# Patient Record
Sex: Male | Born: 1935 | Race: White | Hispanic: No | Marital: Married | Smoking: Former smoker
Health system: Southern US, Community
[De-identification: ages and names within clinical notes are randomized; demographics above are authoritative.]

## PROBLEM LIST (undated history)

## (undated) DIAGNOSIS — K3532 Acute appendicitis with perforation and localized peritonitis, without abscess: Secondary | ICD-10-CM

## (undated) DIAGNOSIS — M069 Rheumatoid arthritis, unspecified: Secondary | ICD-10-CM

## (undated) DIAGNOSIS — K439 Ventral hernia without obstruction or gangrene: Secondary | ICD-10-CM

## (undated) HISTORY — DX: Rheumatoid arthritis, unspecified: M06.9

## (undated) HISTORY — PX: OTHER SURGICAL HISTORY: SHX169

## (undated) HISTORY — DX: Acute appendicitis with perforation, localized peritonitis, and gangrene, without abscess: K35.32

## (undated) HISTORY — PX: HAND SURGERY: SHX662

## (undated) HISTORY — DX: Ventral hernia without obstruction or gangrene: K43.9

---

## 1976-01-02 HISTORY — PX: APPENDECTOMY: SHX54

## 1980-01-02 HISTORY — PX: SHOULDER SURGERY: SHX246

## 1990-01-01 HISTORY — PX: SHOULDER SURGERY: SHX246

## 1998-03-04 ENCOUNTER — Inpatient Hospital Stay (HOSPITAL_COMMUNITY): Admission: RE | Admit: 1998-03-04 | Discharge: 1998-03-07 | Payer: Self-pay | Admitting: Orthopaedic Surgery

## 1998-07-07 ENCOUNTER — Ambulatory Visit (HOSPITAL_BASED_OUTPATIENT_CLINIC_OR_DEPARTMENT_OTHER): Admission: RE | Admit: 1998-07-07 | Discharge: 1998-07-07 | Payer: Self-pay | Admitting: Orthopedic Surgery

## 1998-09-13 ENCOUNTER — Ambulatory Visit (HOSPITAL_BASED_OUTPATIENT_CLINIC_OR_DEPARTMENT_OTHER): Admission: RE | Admit: 1998-09-13 | Discharge: 1998-09-13 | Payer: Self-pay | Admitting: Orthopaedic Surgery

## 1998-10-04 ENCOUNTER — Other Ambulatory Visit: Admission: RE | Admit: 1998-10-04 | Discharge: 1998-10-04 | Payer: Self-pay

## 1999-01-27 ENCOUNTER — Ambulatory Visit (HOSPITAL_BASED_OUTPATIENT_CLINIC_OR_DEPARTMENT_OTHER): Admission: RE | Admit: 1999-01-27 | Discharge: 1999-01-27 | Payer: Self-pay | Admitting: Orthopedic Surgery

## 1999-10-10 ENCOUNTER — Encounter: Payer: Self-pay | Admitting: Orthopedic Surgery

## 1999-10-12 ENCOUNTER — Encounter (INDEPENDENT_AMBULATORY_CARE_PROVIDER_SITE_OTHER): Payer: Self-pay | Admitting: *Deleted

## 1999-10-13 ENCOUNTER — Inpatient Hospital Stay (HOSPITAL_COMMUNITY): Admission: RE | Admit: 1999-10-13 | Discharge: 1999-10-14 | Payer: Self-pay | Admitting: Orthopedic Surgery

## 2000-01-31 ENCOUNTER — Ambulatory Visit (HOSPITAL_BASED_OUTPATIENT_CLINIC_OR_DEPARTMENT_OTHER): Admission: RE | Admit: 2000-01-31 | Discharge: 2000-01-31 | Payer: Self-pay | Admitting: Orthopedic Surgery

## 2000-05-16 ENCOUNTER — Ambulatory Visit (HOSPITAL_COMMUNITY): Admission: RE | Admit: 2000-05-16 | Discharge: 2000-05-16 | Payer: Self-pay | Admitting: Internal Medicine

## 2000-05-16 ENCOUNTER — Encounter: Payer: Self-pay | Admitting: Internal Medicine

## 2002-07-15 ENCOUNTER — Ambulatory Visit (HOSPITAL_COMMUNITY): Admission: RE | Admit: 2002-07-15 | Discharge: 2002-07-15 | Payer: Self-pay | Admitting: Internal Medicine

## 2002-07-15 ENCOUNTER — Encounter: Payer: Self-pay | Admitting: Internal Medicine

## 2003-01-11 ENCOUNTER — Ambulatory Visit (HOSPITAL_COMMUNITY): Admission: RE | Admit: 2003-01-11 | Discharge: 2003-01-11 | Payer: Self-pay | Admitting: Orthopedic Surgery

## 2003-04-27 ENCOUNTER — Encounter (INDEPENDENT_AMBULATORY_CARE_PROVIDER_SITE_OTHER): Payer: Self-pay | Admitting: *Deleted

## 2003-04-28 ENCOUNTER — Inpatient Hospital Stay (HOSPITAL_COMMUNITY): Admission: RE | Admit: 2003-04-28 | Discharge: 2003-05-01 | Payer: Self-pay | Admitting: Orthopedic Surgery

## 2003-09-30 ENCOUNTER — Inpatient Hospital Stay (HOSPITAL_COMMUNITY): Admission: RE | Admit: 2003-09-30 | Discharge: 2003-10-04 | Payer: Self-pay | Admitting: Orthopedic Surgery

## 2003-09-30 ENCOUNTER — Encounter (INDEPENDENT_AMBULATORY_CARE_PROVIDER_SITE_OTHER): Payer: Self-pay | Admitting: *Deleted

## 2004-02-24 ENCOUNTER — Encounter (INDEPENDENT_AMBULATORY_CARE_PROVIDER_SITE_OTHER): Payer: Self-pay | Admitting: *Deleted

## 2004-02-24 ENCOUNTER — Inpatient Hospital Stay (HOSPITAL_COMMUNITY): Admission: RE | Admit: 2004-02-24 | Discharge: 2004-02-27 | Payer: Self-pay | Admitting: Orthopedic Surgery

## 2004-07-20 ENCOUNTER — Ambulatory Visit (HOSPITAL_COMMUNITY): Admission: RE | Admit: 2004-07-20 | Discharge: 2004-07-20 | Payer: Self-pay | Admitting: Family Medicine

## 2004-07-21 ENCOUNTER — Ambulatory Visit: Payer: Self-pay | Admitting: Internal Medicine

## 2005-01-25 ENCOUNTER — Encounter (INDEPENDENT_AMBULATORY_CARE_PROVIDER_SITE_OTHER): Payer: Self-pay | Admitting: *Deleted

## 2005-01-25 ENCOUNTER — Inpatient Hospital Stay (HOSPITAL_COMMUNITY): Admission: RE | Admit: 2005-01-25 | Discharge: 2005-01-28 | Payer: Self-pay | Admitting: Orthopedic Surgery

## 2005-04-13 ENCOUNTER — Encounter (INDEPENDENT_AMBULATORY_CARE_PROVIDER_SITE_OTHER): Payer: Self-pay | Admitting: *Deleted

## 2005-04-13 ENCOUNTER — Inpatient Hospital Stay (HOSPITAL_COMMUNITY): Admission: RE | Admit: 2005-04-13 | Discharge: 2005-04-15 | Payer: Self-pay | Admitting: Orthopedic Surgery

## 2006-01-24 ENCOUNTER — Emergency Department (HOSPITAL_COMMUNITY): Admission: EM | Admit: 2006-01-24 | Discharge: 2006-01-25 | Payer: Self-pay | Admitting: Emergency Medicine

## 2006-12-27 ENCOUNTER — Encounter (INDEPENDENT_AMBULATORY_CARE_PROVIDER_SITE_OTHER): Payer: Self-pay | Admitting: Orthopedic Surgery

## 2006-12-27 ENCOUNTER — Inpatient Hospital Stay (HOSPITAL_COMMUNITY): Admission: RE | Admit: 2006-12-27 | Discharge: 2006-12-30 | Payer: Self-pay | Admitting: Orthopedic Surgery

## 2008-12-10 ENCOUNTER — Ambulatory Visit: Payer: Self-pay | Admitting: Internal Medicine

## 2008-12-10 DIAGNOSIS — K439 Ventral hernia without obstruction or gangrene: Secondary | ICD-10-CM

## 2008-12-12 DIAGNOSIS — G809 Cerebral palsy, unspecified: Secondary | ICD-10-CM | POA: Insufficient documentation

## 2008-12-12 DIAGNOSIS — M069 Rheumatoid arthritis, unspecified: Secondary | ICD-10-CM | POA: Insufficient documentation

## 2009-04-14 ENCOUNTER — Encounter: Payer: Self-pay | Admitting: Internal Medicine

## 2009-05-02 ENCOUNTER — Ambulatory Visit: Payer: Self-pay | Admitting: Internal Medicine

## 2009-05-31 ENCOUNTER — Inpatient Hospital Stay (HOSPITAL_COMMUNITY): Admission: RE | Admit: 2009-05-31 | Discharge: 2009-06-02 | Payer: Self-pay | Admitting: Orthopedic Surgery

## 2010-01-31 NOTE — Letter (Signed)
Summary: [facility]  [facility]   Imported By: Sherian Rein 05/03/2009 15:06:02  _____________________________________________________________________  External Attachment:    Type:   Image     Comment:   External Document

## 2010-01-31 NOTE — Assessment & Plan Note (Signed)
Summary: office visit before surg on may 31/ ab   Vital Signs:  Patient profile:   77 year old male Height:      62 inches Weight:      128 pounds O2 Sat:      98 % on Room air Temp:     98.1 degrees F oral Pulse rate:   68 / minute BP sitting:   144 / 72  (left arm) Cuff size:   regular  Vitals Entered By: Bill Salinas CMA (May 02, 2009 8:57 AM)  O2 Flow:  Room air CC: office visit before Left hip replacement with Dr Charlann Boxer on May 31st/  pt has never had pneumonia vaccine or tetanus/ ab   Primary Care Provider:  Jacques Navy MD  CC:  office visit before Left hip replacement with Dr Charlann Boxer on May 31st/  pt has never had pneumonia vaccine or tetanus/ ab.  History of Present Illness: Patient presents for preoperative evaluation for left TKR with Dr. Charlann Boxer. He is having on-ging pain in the left hip but gets adequate relief with ibuprofen 400mg  once a day. He has no other active medical complaints.   Current Medications (verified): 1)  Ibuprofen 200 Mg Tabs (Ibuprofen) .... 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) ABDOMINAL WALL HERNIA (ICD-553.20)  Past Surgical History: Last updated: 12/24/08 Appendectomy-ruptured Laparotomy-exploratory: x 3 due to complications of ruptured appendix Shoulder replacement '82 - post-traumatic Metatarsal joint reconstruction - '93 Elbow surgery  Family History: Last updated: 24-Dec-2008 Parents deceased Negative - colon or prostate cancer  Social History: Last updated: 24-Dec-2008 College educated married 20 yrs - divorced; married '84 1 son - murdered @ 80; 5 daughters - '60, '62, '66, '91, '93; 3 grandchildren work - reitred years ago; primary homemaker, 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    Physical Exam  General:  Thin older white male looking younger than his stated age. He is no distress Head:  normocephalic and atraumatic.   Eyes:  vision grossly intact, pupils equal, pupils round, corneas and lenses clear, and no injection.   Ears:  R ear normal and L ear normal.  some cerumen Nose:  no external deformity and no external erythema.   Mouth:  good dental repair no oral lesions Neck:  supple, full ROM, no thyromegaly, and no cervical lymphadenopathy.   Chest Wall:  no deformities and no tenderness.   Lungs:  normal respiratory effort, no intercostal retractions, no accessory muscle use, no dullness, and no wheezes.   Heart:  normal rate, regular rhythm, no JVD, and no HJR.   Abdomen:  soft, non-tender, normal bowel sounds, no masses, no guarding, and no hepatomegaly.   Rectal:  deferred Prostate:  deferred Msk:  chronic deformities  both hands from arthritis with joint destruction. No ddeformity of the LE. Pulses:  2+ radial and DP pulses Extremities:  No clubbing, cyanosis, edema, or deformity noted with normal full range of motion of all joints.   Neurologic:  alert & oriented X3, cranial nerves II-XII intact, strength normal in all extremities, sensation intact to light touch, and DTRs symmetrical and normal.   Skin:  turgor  normal, color normal, no rashes, no ulcerations, and no edema.   Cervical Nodes:  no anterior cervical adenopathy and no posterior cervical adenopathy.   Axillary Nodes:  no R axillary adenopathy and no L axillary adenopathy.   Psych:  Oriented X3, memory intact for recent and remote, normally interactive, and good eye contact.     Impression & Recommendations:  Problem # 1:  RHEUMATOID ARTHRITIS (ICD-714.0) Stable. ON no remiitive  agents.  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 EKG with minor sinus arrhythmia. V1-V2 with abnl QRS which may represent old injury. He has no current cardiac issues.  Patient is medically cleared for surgery.   Complete Medication List: 1)  Ibuprofen 200 Mg Tabs (Ibuprofen) .... As needed for arthritis   Not Administered:    Influenza Vaccine # 1 not given due to: declined

## 2010-03-20 LAB — BASIC METABOLIC PANEL
CO2: 27 mEq/L (ref 19–32)
CO2: 26 mEq/L (ref 19–32)
Calcium: 8.1 mg/dL — ABNORMAL LOW (ref 8.4–10.5)
Calcium: 8.9 mg/dL (ref 8.4–10.5)
Chloride: 105 mEq/L (ref 96–112)
Chloride: 103 mEq/L (ref 96–112)
Creatinine, Ser: 0.64 mg/dL (ref 0.4–1.5)
GFR calc Af Amer: >60 mL/min (ref >60)
GFR calc Af Amer: >60 mL/min (ref >60)
GFR calc Af Amer: >60 mL/min (ref >60)
GFR calc non Af Amer: >60 mL/min (ref >60)
Glucose, Bld: 86 mg/dL (ref 70–99)
Potassium: 4.1 mEq/L (ref 3.5–5.1)
Potassium: 4.5 mEq/L (ref 3.5–5.1)
Potassium: 4.5 mEq/L (ref 3.5–5.1)
Sodium: 135 mEq/L (ref 135–145)
Sodium: 136 mEq/L (ref 135–145)

## 2010-03-20 LAB — CBC
HCT: 26 % — ABNORMAL LOW (ref 39.0–52.0)
MCHC: 33.3 g/dL (ref 30.0–36.0)
MCV: 83.8 fL (ref 78.0–100.0)
Platelets: 169 10*3/uL (ref 150–400)
RBC: 2.93 MIL/uL — ABNORMAL LOW (ref 4.22–5.81)
RBC: 4.53 MIL/uL (ref 4.22–5.81)
RDW: 15.5 % (ref 11.5–15.5)
WBC: 6.8 10*3/uL (ref 4.0–10.5)

## 2010-03-20 LAB — URINALYSIS, ROUTINE W REFLEX MICROSCOPIC
Bilirubin Urine: NEGATIVE
Ketones, ur: NEGATIVE mg/dL
Leukocytes, UA: NEGATIVE
Protein, ur: 100 mg/dL — AB

## 2010-03-20 LAB — URINE MICROSCOPIC-ADD ON

## 2010-03-20 LAB — DIFFERENTIAL
Monocytes Relative: 9 % (ref 3–12)
Neutrophils Relative %: 72 % (ref 43–77)

## 2010-03-20 LAB — ABO/RH: ABO/RH(D): O POS

## 2010-03-20 LAB — TYPE AND SCREEN: ABO/RH(D): O POS

## 2010-05-16 NOTE — Op Note (Signed)
[name], [name]              ACCOUNT NO.:  [id number]   MEDICAL RECORD NO.:  [id number]          PATIENT TYPE:  INP   LOCATION:  2854                         FACILITY:  [facility]   PHYSICIAN:  Dionne Ano. Gramig III, M.D.DATE OF BIRTH:  1932-08-11   DATE OF PROCEDURE:  12/27/2006  DATE OF DISCHARGE:                               OPERATIVE REPORT   PREOPERATIVE DIAGNOSIS:  Rheumatoid arthritis, severe, with bilateral  upper-and-lower extremity involvement.  The patient presents for removal  of a left knee mass, deep in nature, x2; left elbow mass x3; and  associated middle finger extensor tenolysis, possible PIP revision  arthroplasty, as well as left thumb IP fusion, and removal of deep  hardware from the left index finger.   He understands risks and benefits, various imponderables, etc., and  desires to proceed.  I have discussed him extensively with all issues.  He is well aware of the management of rheumatoid arthritis, and the  other measures which we have discussed previously, and on other  operative settings.   OPERATION DESCRIPTION:  The patient was administered __________  , taken  to the operating  room where a smooth induction of general anesthesia,  laid supine,  fully padded, prepped and draped in a sterile fashion  about the left upper extremity and left lower extremity.  Once this  done, sterile field was isolated about the knee, and an incision was  made, dissection was carried down overlying the patella, and a deep mass  consistent with rheumatoid nodule was excised.  This was benign and  consistent with a rheumatoid nodule.  This was taken off of the  periosteal tissue of the patella.  Following this, a second longitudinal  incision 2 cm in nature was carried out, and deep dissection was carried  down and a deep mass removal was accomplished about the left knee in a  separate location as well.  This was done without difficulty.  Once this  done, I then irrigated  both areas copiously, and closed them with 3-0  Prolene.  The patient tolerated this well.  He had excellent refill,  soft compartments, no complicating features.  He was dressed sterilely  with Xeroform gauze and Kerlix.   Following this the left elbow was identified.  He had a lateral mass  which underwent incision.  Deep dissection was carried down.  This was  then removed off of the lateral epicondylar region.  This was irrigated  and sutured with 3-0 Prolene.  This was a deep mass removal about the  elbow x1.   Following this, a second deep elbow mass was removed from the distal  portion of the elbow.  This was in line with subcutaneous border at the  ulna.  Dissection was carried down, this was removed off of the  periosteum.  Dislocation was approximately 4.5 cm distal to the tip of  the olecranon.   Following the second deep mass removal, the patient had a third deep  mass removed from the elbow.  This was performed by incising the skin,  and very carefully removing a large deep mass  consistent with rheumatoid  nodule and significant elbow bursa.  There was some fluid in this area,  and I did culture this for aerobic and anaerobic cultures.  Following  this, I then performed copious irrigation of the wound, and removed the  bursa and rheumatoid nodule mass.  This was the third deep excision.  Following this, a complex closure was accomplished, without difficulty.  I took care to avoid the ulnar nerve, and did not have to dissect it in  the operative dissection.   Following this, I then performed very careful and cautious approach to  the middle finger.  A curvilinear incision was made, and dissection was  carried down the extensor tendon apparatus was stuck down, and the  patient underwent a PIP manipulation as well as extensive extensor  tenolysis and tenosynovectomy.  I was able to get the PIP joint to flex  down to 85 degrees, and the components were stable.  Given all  issues,  and the motion gained, I did not feel that revision would be necessary  at this setting, and chose to place Gelfoam on the wound, and turned  attention towards the left index finger.   The left index finger had incision made and dissection was carried down.  Deep hardware removal was performed about the tip.  This was a long  Kirschner wire, and was removed without complicating feature.   Following this, I then made a dorsal approach to the thumb.  Dissection  was carried down and the IP joint was then released.  I shot-gun opened,  and covered-cone preparation was accomplished until I was able to  perform fusion.  I created cancellus surfaces, removed osteophytes, and  then preplaced a K-wire for a micro AccuTrak screw.  Following this, a  20 mm AccuTrak screw was placed in the thumb, tipped to my satisfaction  without difficulty, and there no complicating features.   Once this was done, I then performed careful irrigation to all wounds  with the tourniquet deflated.  Hemostasis was secured with bipolar  electrocautery, and the patient then underwent closure of the wound.  There were no complicating features, and all sponge, needle, and  instrument counts were reported as corrected.  The thumb was dressed  with a splint.  The other wounds were dressed with sterile compressive  bandages.  He was stable in the recovery room, alert and oriented in no  acute distress, and I discussed all issues with the family.  We will  admit him for IV antibiotics, postop observation, and pain control.  All  questions have been encouraged, answered, and addressed.           ______________________________  Dionne Ano. Everlene Other, M.D.     Nash Mantis  D:  12/27/2006  T:  12/27/2006  Job:  [id number]

## 2010-05-19 NOTE — Discharge Summary (Signed)
NAMECLINTON, DRAGONE              ACCOUNT NO.:  [id number]   MEDICAL RECORD NO.:  [id number]          PATIENT TYPE:  INP   LOCATION:  5006                         FACILITY:  [facility]   PHYSICIAN:  Dionne Ano. Gramig, M.D.DATE OF BIRTH:  12-18-1932   DATE OF ADMISSION:  12/27/2006  DATE OF DISCHARGE:  12/30/2006                               DISCHARGE SUMMARY   ADMITTING DIAGNOSIS:  End-stage rheumatoid arthritis, severe in nature  with bilateral upper and lower extremity involvement with noted  rheumatoid nodule x2 about the left knee, rheumatoid nodule x3 about the  left elbow, end-stage degenerative disease about the left IP, prior left  index finger fusion presenting with painful hardware, and finally prior  PIP arthroplasty with noted adhesions.   DISCHARGE DIAGNOSIS:  End-stage rheumatoid arthritis, severe in nature  with bilateral upper and lower extremity involvement with noted  rheumatoid nodule x2 about the left knee, rheumatoid nodule x3 about the  left elbow, end-stage degenerative disease about the left IP, prior left  index finger fusion presenting with painful hardware, and finally prior  PIP arthroplasty with noted adhesions.   SURGEON:  Dominica Severin, M.D.   PROCEDURE:  1. Mass removal x2, deep in nature, left knee.  2. Left elbow mass x3.  3. Middle finger extensor tenolysis.  4. Left thumb IP fusion.  5. Removal of deep hardware, left index finger.   CONSULTANTS:  None.   HISTORY OF PRESENT ILLNESS:  [name] is an extremely pleasant  gentleman, 77 years of age, and well known to our practice.  He  underwent prior rheumatoid nodule mass removals in the past and has  underwent reconstructions about his digits previously.  He presented  most recently for the above-mentioned difficulties and to proceed  accordingly.   The patient preoperatively was noted to have a hemoglobin and hematocrit  of 12.3 and 36.8, respectively.  Glucose was 106.  Otherwise,  hemogram  and blood chemistry within normal limits.  Urinalysis was negative.  Her  EKG revealed normal sinus rhythm with sinus arrhythmia, septal infarct  age undetermined.  The patient was clear and stable to undergo the above-  mentioned procedure.  On December 27, 2006, the patient underwent the  above procedure without difficulty.  Please see operative report for  full details.  Intraoperative cultures were obtained about the masses  and were consistent with rheumatoid nodules without signs of infective  process present.  He was noted to have palisading granulomas with  central degeneration, again consistent with rheumatoid nodules.  The  patient was admitted to the orthopedic unit for standard postoperative  orders including pain control, IV antibiotics, and close observation.  On postoperative day #1, he was doing very well.  He was having minimal  pain.  He was alert and oriented.  Vital signs were stable.  Neurovascularly, he was intact to the upper and lower extremities and  looked very well overall.  The tentative plan was to discharge him the  following day.  He was tolerating p.o.'s without difficulty.  His heart  rate was regular and rhythm.  Abdomen was nontender.  Chest was clear to  auscultation.  The following day, December 29, 2006, he was doing very  well overall but having some difficulties with his right elbow.  He was  noted to have slight erythema about the elbow with increased pain with  ADLs noted.  Upon evaluation of this,  it appeared that he had  exacerbation of his rheumatoid as well as mild cellulitis.  He had no  signs or symptoms of compartment syndrome, dystrophy or deep infection  and was afebrile.  He was tolerating p.o.'s without difficulty, voiding  without difficulty.  Local wound care was implemented to the right upper  extremity with diligent elevation.  On the following day, the patient  was noted to be improved overall in stable condition without  complaints  and eager for discharge.  Vital signs were stable.  He was afebrile.  The elbow was reexamined.  Decision made to perform a corticosteroid  injection secondary to the synovitic process.  Decision was also made to  discharge him home given his improved state overall.  His heart was  regular, rate and rhythm.  Chest clear to auscultation.  Abdomen was  nontender.  Upper and lower extremities were neurovascularly intact.  His wounds were intact without signs of infection or dystrophy.   ASSESSMENT/FINAL DIAGNOSIS:  Please see discharge diagnosis.   CONDITION:  On discharge is improved.   DIET:  Is regular.   ACTIVITIES:  He will keep his dressings clean, dry and intact to the  upper and lower extremities.  He will ice the right elbow on a home  basis 15-20 minutes at a time, 3 to 4 times over this evening.   His discharge medication will include:  1. Percocet 1 to 2 p.o. q.4-6h. p.r.n.  2. Keflex 500 mg 1 p.o. q.i.d. for 10 days.  3. Robaxin 500 mg 1 p.o. q.6h. p.r.n. spasm #40.   FOLLOWUP:  With Dr. Amanda Pea next Wednesday for wound check to make sure  he is doing well overall.  In the interim, he will call for any  questions or concerns.      Karie Chimera, P.A.-C.    ______________________________  Dionne Ano. Amanda Pea, M.D.    BB/MEDQ  D:  02/04/2007  T:  02/05/2007  Job:  [id number]

## 2010-05-19 NOTE — Op Note (Signed)
[place]  Patient:    [name], [name]                     MRN: [id number] Proc. Date: 10/12/99 Adm. 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  OPERATION PERFORMED:  Left revision total shoulder arthroplasty.  SURGEON:  Georgena Spurling, M.D.  ASSISTANT:  Jamelle Rushing, P.A.  ANESTHESIA:  General endotracheal.  INDICATIONS FOR PROCEDURE:  The patient is a 77 year old white male who is status post total shoulder arthroplasty with attempt at rotator cuff repair. He is now subluxing superiorly due to rotator cuff insufficiency and after conservative options had failed informed consent was obtained.  He was taken to the operating room for revision to a larger head.  DESCRIPTION OF PROCEDURE:  The patient was laid supine and administered general endotracheal anesthesia and then laid in a beach chair position.  The right shoulder and upper extremity was prepped and draped in the usual sterile fashion.  We went through the old incision with a #10 blade, approximately 12 in length.  We found the deltopectoral interval which was scarred down and difficult to identify.  We went through the deltoid and placed self-retaining retractors in place.  The coracobrachialis was basically unidentifiable and scarred down to the subscapularis, so I elected to incise sharply the subscapularis down to the rotator cuff interval which obviously was deficient. We subperiosteally reflected the sleeve of subscapularis and capsule, tagged it with 5-0 Vicryl stitches.  We then delivered the head, removed the head and debrided the synovitis within the joint.  There was a large deficiency superiorly but there was some scar  tissue that we could use for closure.  The glenoid was palpated and was quite well fixed and was in excellent position and had very little wear at all.  We trialed several different head options including bipolar and fixed and variable offset heads.  We then chose the large 21 mm thickness head with 8 mm offset option, size 50 diameter.  This was significantly larger than the prior component and when dialed superiorly gave excellent articulation with the acromion.  We therefore irrigated the wound copiously and then tamped on the real prosthetic head.  We then trialed it and it had excellent internal external rotation, excellent superior coverage and excellent stability.  We then did a meticulous closure, closing the rotator cuff interval as well as the subscapularis down to our limb of soft tissue which was left just anterior to the bicipital groove.  We did approximately 10 figure-of-eight interrupted #2 Ethibond sutures.  We then closed the deltopectoral interval with approximately 15 interrupted figure-of-eight 0 Vicryl sutures and then subcuticular 2-0 Vicryls and then skin staples.  There was very little bleeding, so we elected not to put drains.  We then dressed with Xeroform, 4 x 4s, ABDs and Hypafix.  We then placed the patient in a sling and swath.  The patient tolerated the procedure well, was extubated and taken to recovery in stable condition.  We did infiltrate the wound with 0.5% Marcaine with epinephrine at the end of the case, approximately 20 cc.  DRAINS:  None.  COMPLICATIONS:  None.  TOURNIQUETS:  None. DD:  10/12/99 TD:  10/13/99 Job: [id number]

## 2010-05-19 NOTE — Op Note (Signed)
[name], [name]              ACCOUNT NO.:  [id number]   MEDICAL RECORD NO.:  [id number]          PATIENT TYPE:  INP   LOCATION:  2899                         FACILITY:  [facility]   PHYSICIAN:  Dionne Ano. Gramig III, M.D.DATE OF BIRTH:  [birth year]   DATE OF PROCEDURE:  01/25/2005  DATE OF DISCHARGE:                                 OPERATIVE REPORT   PREOPERATIVE DIAGNOSES:  1.  Rheumatoid nodules x2, right foot about the metatarsophalangeal region      and heel region.  2.  Rheumatoid deformity, right hand with noted proximal interphalangeal      arthroplasty with subsequent arthrofibrosis about the index finger and      small finger, right hand and noted severe pain and deformity, ring      finger metacarpophalangeal joint.   POSTOPERATIVE DIAGNOSES:  1.  Rheumatoid nodules x2, right foot about the metatarsophalangeal region      and heel region.  2.  Rheumatoid deformity, right hand with noted proximal interphalangeal      arthroplasty with subsequent arthrofibrosis about the index finger and      small finger, right hand and noted severe pain and deformity, ring      finger metacarpophalangeal joint.   OPERATION PERFORMED:  1.  Right ring finger arthroplasty with a NeuFlex implant, size 40 about the      right ring finger metacarpophalangeal joint.  2.  Right index finger interphalangeal joint tenolysis with tenosynovectomy      and capsulotomy.  3.  Right small finger tenolysis, tenosynovectomy and capsular release.  4.  Manipulation under anesthesia, right index finger.  5.  Manipulation under anesthesia, right small finger.  6.  Removal of foot mass about the forefoot.  7.  Removal of right foot mass about the heel region.  This was a 2 x 3 cm      mass about the heel and a 2 x 2 cm mass about the forefoot.   SURGEON:  Dionne Ano. Amanda Pea, M.D.   ASSISTANT:  Karie Chimera, P.A.-C.   COMPLICATIONS:  None.   ANESTHESIA:  General with preoperative regional block about  the right upper  extremity.   SPECIMENS:  Two from the foot.   DRAINS:  None.   INDICATIONS FOR PROCEDURE:  [name] is a 77 year old male who presents  with the above mentioned diagnosis.  I have counseled the patient in regard  to risks and benefits of surgery including risks of infection, bleeding,  anesthesia, damage to normal structures, and failure of surgery to  accomplish its intended goals of relieving symptoms and restoring function.  With this in mind, he desires to proceed.  All questions have been  encouraged and answered preoperatively.   DESCRIPTION OF PROCEDURE:  The patient was seen by myself and anesthesia,  given preoperative Ancef in the holding area, extremities were marked,  discussion undertaken and consent signed.  He was then given a block by Dr.  Bedelia Person in the holding area and then was taken to the operating suite,  underwent a LMA anesthetic.  He tolerated this well.  Following this,  the  patient was prepped and draped about the right lower extremity and right  upper extremity with Betadine scrub and paint.  Once this was done, we  performed an incision __________ under tourniquet control about the right  index finger.  Dissection was carried down, flap elevated.  Extensive  tenolysis of the extensor apparatus and capsulotomy with capsular release  was performed.  I then performed a manipulation under anesthesia of the PIP  joint and I was able to achieve 65 degrees of flexion.  I did not want to  push past this as I did not want to compromise his stability which was  excellent.  X-rays were taken which looked quite well and I was pleased with  this and the findings.  I irrigated copiously and turned attention towards  the small finger where a similar incision was made.  Dissection was carried  down.  Extensive extensor tenolysis and tenosynovectomy was accomplished as  well as capsular release of the PIP joint.  Following this, manipulation  under  anesthesia allowed for excellent range of motion.  He tolerated this  well. He had range of motion 90 degrees about the small finger without  difficulty.  Following this, we irrigated copiously.  Next, a dorsal  incision was made about the MDP joint.  Dissection was carried down.  Extensor apparatus was split.  Capsule was released.  Collateral ligaments  were released.  Saw cut was placed in the metacarpal head and aligned  nicely.  Following this, I broached the proximal phalanx and metacarpal to a  size 40 fit which looked excellent.  Intrinsic release was accomplished and  following this, trial was placed.  All looked well.  X-rays looked excellent  and I was pleased with this.  I then performed copious irrigation of the  wound followed by placement of the final prosthesis with a no touch  technique.  The patient had excellent fit, good stability and no  complicating features.  Following this, the patient then underwent closure  of the capsule loosely and following this, extensor was realigned with 4-0  FiberWire to my satisfaction without difficulty.  There were no complicating  features.  All looked quite well. Final copy of x-rays were made for  permanent documentation.  Once this was done, I then irrigated the wounds  copiously with tourniquet deflated, obtained hemostasis and closed wounds  with Prolene.  All looked quite well. He was placed in sterile dressing and  later in plaster of Paris splint. Once this was done, attention was turned  towards the foot.  Tourniquet was elevated and incision was made over the  heel.  The patient had a large 2 x 3 rheumatoid nodule removed without  difficulty.  I obtained hemostasis with cautery without difficulty and  following this, closed the wound after copious irrigation and securing  hemostasis.  Once this was done, the patient had a forefoot mass 2 x 2 cm in nature removed as well off the fourth MTP region. I  took care to not  encroach  upon the capsule and to avoid the neurovascular bundles which were  carefully protected.  The mass was moved without difficulty.  Both masses  sent for specimen.  He tolerated this well and there were no complicating  features.  Following this, wounds were closed with 3-0 Prolene and sterile  dressing was applied.  He was extubated from the LMA anesthetic and taken to  the recovery room.  He will be monitored and  placed on intravenous  antibiotics, pain management according to his needs and general  postoperative observation.  I have discussed with him the do's and don't's,  etc, and all questions have been encouraged and answered.           ______________________________  Dionne Ano. Everlene Other, M.D.     Nash Mantis  D:  01/25/2005  T:  01/26/2005  Job:  [id number]

## 2010-05-19 NOTE — Discharge Summary (Signed)
[name], [name]              ACCOUNT NO.:  [id number]   MEDICAL RECORD NO.:  [id number]          PATIENT TYPE:  INP   LOCATION:  5041                         FACILITY:  [facility]   PHYSICIAN:  Dionne Ano. Gramig, M.D.DATE OF BIRTH:  30-Dec-1932   DATE OF ADMISSION:  09/30/2003  DATE OF DISCHARGE:  10/04/2003                                 DISCHARGE SUMMARY   ADMISSION DIAGNOSES:  1.  History of rheumatoid arthritis with severe deformities of the hands.  2.  History of rheumatoid nodules about the thumb, elbow and knee.   DISCHARGE DIAGNOSES:  1.  History of rheumatoid arthritis with severe deformities of the hands,      improved.  2.  History of rheumatoid nodules about the thumb, elbow and knee, improved.   SURGEON:  Dionne Ano. Amanda Pea, M.D.   CONSULTATIONS:  None.   HISTORY OF PRESENT ILLNESS:  The patient is a very pleasant 77 year old  gentleman with a history of severe rheumatoid arthritis, significantly  effecting his hands, with noted deformities in a typical T-fashion and  effecting the MCP and PIP joints.  In addition, he is noted to have multiple  rheumatoid nodules.  He has previously undergone arthroplasties to the MCP's  and PIP's of the right hand, and presents for multiple arthroplasties of the  index through small finger of the left hand.  In addition, the excision of  rheumatoid nodules about the thumb, elbow and leg.   PREOPERATIVE LABORATORY DATA:  H&H of 13 and 38.1 respectively.  Metabolic  panel shows the glucose was 146, otherwise his abdomen was 3.1.  Liver  enzymes showed an ALT of 121.  Chest radiograph showed chronic interstitial change without evidence for  acute cardiopulmonary process.   HOSPITAL COURSE:  The patient was admitted on September 30, 2003, to undergo  an elective surgery in the form of a metacarpal arthroscopy of the ring  finger to the left hand, a metacarpal arthroplasty of the small finger of  the left hand, metacarpal  arthroplasty of the middle finger of the left hand  and a metacarpal arthroplasty of the indeed finger of the left hand, as well  as ECRB tendon lengthening of the left hand, ERCRO tendon lengthening of the  left hand, removal of left  mass x3, with removal of left elbow mass and an  arthrotomy and mesh removal of the left knee.  Please see the operative  report for full details.  The patient tolerated the procedure very well, and  there were no complications noted.  The patient postoperatively was started  on the standard IV antibiotics, pain management and a PT and OT consultation  was obtained.  On postoperative day number one the patient was doing well overall.  His  pain was tolerable.  He was receiving Ancef without difficulty.  He was  noted to have good refill in all of his digits.  He did have some  paresthesias noted, not unexpected, given the extensive surgery.  His vital  signs were stable.  He was afebrile.  The patient was noted to have  difficulty voiding, and a  Foley catheter was placed, with plans for  discontinuing the Foley catheter for the next a.m.  He had no signs of a  deep venous thrombosis.  His wounds were clean, dry and intact.  He had no  signs of infection about the lower extremity.  His dressings were changed  without difficulty.  On the following day the patient was doing better in terms of his pain, and  not having difficulty urinating; however, he did complain of irritation to  the skin at the proximal aspect of his elbow and humerus.  His vital signs  were stable.  He was afebrile.  His dressings were changed to the knee.  His  incision was clean, dry and intact without signs of infection.  He was noted  to have a slight irritation and erythema about the knee; however, this did  not appear as an infective process.  The left upper extremity incisions were  clean, dry and intact.  He was noted to have erythema of the proximal  humerus.  Overall his sensation  had remained consistent with his previous  examinations.  On the following day he was doing much better.  He was watching TV.  His  pain was controlled.  He stated that he was somewhat itchy at the level of  the proximal humerus.  He states he was prone to get a heat rash and had  this on several occasions at home.  He was eating, drinking and voiding  without difficulty.  He had not had a bowel movement at that time.  He  denied fever, chills, nausea, or vomiting.  He had decreased sensation,  mainly at the left index finger.  His vital signs were stable.  He was  afebrile.  O2 saturation was 97% on room air.  His dressings were removed to  the left upper extremity.  His incisions were clean, dry and intact.  No  discharge noted. Slight erythema.  No signs of ascending cellulitis.  The  forearm site was clean, dry and intact.  Refill sensation was intact to the  digits.  The left lower extremity incision was clean, dry and intact.  No  discharge or signs of infection.  The patient was given Benadryl for  pruritus.  His IV was hep-welled.  He will continue morphine IV for pain and  have scheduled Vicodin p.o.  He was requesting to be discharged home that  day; however, plans were made to discontinue the following day if he was  doing well.  The patient was seen and evaluated by Dr. Amanda Pea on October 04, 2003.  He was  alert and oriented.  His vital signs were stable.  He was afebrile.  He had  excellent refill.  No signs of infective process about the extremities.  His  dressings were removed and his incisions noted to be clean, dry and intact.  He had no signs of deep venous thrombosis in the calf vein.  Due to his  improved condition, the patient was discharged.   DISCHARGE DIAGNOSES:  Status post severe rheumatoid arthritis with multiple  rheumatoid nodules and severe deformities of the hands, status post multiple  arthroplasties and mass removals.  CONDITION ON DISCHARGE:   Improved.   DIET:  A regular diet.   ACTIVITY:  He will keep his dressings clean, dry and intact to the left  upper and lower extremities.  He will be weightbearing as tolerated.   DISCHARGE MEDICATIONS:  1.  He will take an aspirin  daily.  2.  He will take over-the-counter Benadryl.  3.  He was discharged on Vicodin for pain control.  4.  He was to have his regular home medications with him at home.   FOLLOWUP:  He will follow up with Dr. Amanda Pea in two days.  All questions  were encouraged and answered.       BB/MEDQ  D:  12/07/2003  T:  12/07/2003  Job:  [id number]

## 2010-05-19 NOTE — Op Note (Signed)
[place]. [facility]  Patient:    [name], [name]                     MRN: [id number] Proc. Date: 01/31/00 Adm. Date:  [id number] Attending:  Ronne Binning                           Operative Report  PREOPERATIVE DIAGNOSIS:  Rheumatoid arthritis right hand.  POSTOPERATIVE DIAGNOSIS:  Rheumatoid arthritis right hand.  OPERATION:  ______ total wrist arthroplasty right wrist.  Incision nodules from right elbow, right thumb x 3, right index, right middle fingers.  SURGEON:  Nicki Reaper, M.D.  ASSISTANT:  Artist Pais. Mina Marble, M.D.  ANESTHESIA:  General.  ANESTHESIOLOGIST:  Bedelia Person, M.D.  HISTORY:  The patient is a 77 year old male with a history of rheumatoid arthritis.  He has significant deformity to his wrist with pain, deformity to his fingers, multiple nodules present.  He desirous of removal of the nodules, total wrist arthroplasty.  PROCEDURE:  The patient was brought to the operating room, where a general anesthetic was carried out without difficulties.  He was prepped and draped using Betadine scrub and solution with the right arm free.  The limb was exsanguinated with an Esmarch bandage, tourniquet placed high on the arm, was inflated to 250 mmHg.  An incision was made over the thumb radially and ulnarly, carried down through subcutaneous tissue.  The nodules were identified and excised.  There were three nodules present.  Each was excised and sent to pathology.  The wounds were thoroughly irrigated, closed with interrupted 5-0 nylon suture.  These were over the distal phalanx palmarly. The index finger was attended to next.  An incision was made over the mass of the distal phalanx, carried down through subcutaneous tissue.  The mass was again removed without difficulty in toto.  This was closed with interrupted 5-0 nylon suture after irrigation.  A transverse incision was made on the middle phalanx ______ phalangeal joint out  palmarly, carried down through subcutaneous tissue and the nodule was removed without difficulty.  The wound irrigated and closed with interrupted 5-0 nylon sutures.  A separate incision was then made at the elbow.  Two large nodules were present.  These were excised.  With blunt and sharp dissection an exostosis was present on the ulna.  This was removed with a rongeur and the wound was irrigated and closed with interrupted 5-0 nylon sutures.  A longitudinal incision was made using the old scar on the dorsal aspect of the wrist and carried down through subcutaneous tissue.  The extensor retinaculum was split in a zigzag manner maintained on the ulnar aspect distally, radial aspect proximally.  The fourth distal compartment carried down to the fourth dorsal compartment this periosteum was then elevated, a flap of tissue was taken off from the distal radius, left attached distally.  The dissection was carried over to the radial side of the wrist.  This was elevated.  The brachioradialis was partially elevated off from the distal radius.  An incision was made in the distal radioulnar joint.  An oscillating saw was used to cut the ulnar head just proximal to the capitular and the distal ulna was removed subperiosteally.  A template was then placed onto the distal radius and an initial cut made just proximal to the articular surface with an oscillating saw. This bone was removed.  A saw was then used  to make a cut across the proximal aspect of the capitate trying to align this in the plane of the capitate in both A/P and lateral directions to maintain position.  This was done through the scaphoid after derotating the proximal carpal row.  A portion of the distal pull of the scaphoid was left along with the distal portion of the triquetrum.  A hole was then made into the proximal radius and this was rasped for a large radial replacement.  The rasp was then inserted, the guide placed over this and  a second cut made.  A large prosthesis was then placed and found to lie in an adequate position in both A/P and lateral with good rotation.  The prosthesis trial was removed.  A hole was then drilled into the capitate aimed at the third metacarpal.  This allowed placement of the trial prosthesis of the carpal component.  X-rays revealed that the first initial attempt showed that the guidewire was volar to the metacarpal.  This was repositioned and after confirmed on both A/P, lateral and oblique x-rays being in the third metacarpal, this was drilled with a 3.5 mm drill over the guidewire. This was enlarged, the trial prosthesis placed, found to lie in good position.  The radial screw was then placed.  This was a 20 mm screw after drilling the radial portion with a 3.5 mm drill, 20 mm screw was placed.  Position was then confirmed.  The prosthesis was then placed proximally and distally.  The sizer was placed and found to be a large thick component for a medium distal prosthesis.  This was placed.  The prosthesis articulated and found to lie in good position.  The wounds were then copiously irrigated with bacitracin-containing saline solution, dried.  The distal component was then inserted.  A medium sized carpal component was placed.  A 25 mm screw placed radially.  The drill hole placed through the ulnar component and a 20 mm screw placed ulnarly.  Position was checked.  This was found to lie in good position with the stem into the base of the metacarpal.  The medium prosthesis was then inserted.  A bone plug was placed into the radial shaft, impacted with the trial prosthesis for a large proximal component.  The trial was removed, the area irrigated, dried, a packet of methyl methacrylate cement was then mixed. This was placed into the cavity and the large component proximally was placed and held into position until the cement fully hardened.  The thick, medium distal component was then  placed.  Initially, a large was given.  This obvious could not be fit onto the medium distal component and a thick medium component was then used to replace this.  It clicked into position.  The joint was  placed through a full range of motion and found to lie in good position.  The wounds were copiously irrigated with saline.  The retinaculum was used to reinforce the closure of the capsule, which was done with figure-of-eight 4-0 Mersilene sutures.  The retinaculum was closed proximally with 4-0 Mersilene. A TLS wound drain was placed through a separate stab incision.  The subcutaneous tissue was then closed with interrupted 4-0 Vicryl and the skin with a running 5-0 nylon suture.  Sterile compressive dressing, dorsal palmar splint applied.  X-rays revealed the prosthesis in good position prior to placement of the complete dressing.  The patient was then taken to the recovery room for observation in satisfactory condition.  He  was admitted for overnight stay for pain control and antibiotics.  He will be discharged on Percocet and Keflex. DD:  01/31/00 TD:  01/31/00 Job: [id number]

## 2010-05-19 NOTE — Op Note (Signed)
NAMEBARTT, [name]              ACCOUNT NO.:  [id number]   MEDICAL RECORD NO.:  [id number]          PATIENT TYPE:  INP   LOCATION:  5041                         FACILITY:  [facility]   PHYSICIAN:  Dionne Ano. Gramig III, M.D.DATE OF BIRTH:  [birth year]   DATE OF PROCEDURE:  DATE OF DISCHARGE:                                 OPERATIVE REPORT   PREOPERATIVE DIAGNOSES:  1.  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  2.  Thumb masses x3 about the left thumb.  3.  Left elbow mass.  4.  Left intra-articular knee loose body/mass.  5.  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   POSTOPERATIVE DIAGNOSES:  1.  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  2.  Thumb masses x3 about the left thumb.  3.  Left elbow mass.  4.  Left intra-articular knee loose body/mass.  5.  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   PROCEDURES:  1.  Left metacarpal phalangeal joint arthroplasty about the index finger      with size 30 proximal and distal PyroCarbon implants.  2.  Left middle finger metacarpal phalangeal joint arthroplasty with size 30      proximal and 30 distal PyroCarbon implants from [employer].  3.  Left ringer finger metacarpal phalangeal joint arthroplasty with size 20      silicon implant left hand.  4.  Left small finger MCP joint arthroplasty with size 10 silicon implant      from [facility] left hand .  5.  Extensor carpi radialis brevis tendon lengthening left wrist region.  6.  Extensor carpi radialis longus tendon lengthening with Z lengthening      performed left hand about the wrist region.  7.  Removal of left thumb mass x3 separate masses with three separate      incisions.  8.  Removal of large left elbow mass posterior  in location.  9.  Arthrotomy left knee with intra-articular mass removal.  10. Stress radiography.  11. Extensor realignment index, middle, ring and small fingers.  12. Intrinsic release small, ring and middle finger left hand.   SURGEON:  Dionne Ano. Amanda Pea, M.D.   ASSISTANT:  Karie Chimera, P.A.-C.   ANESTHESIA:  General anesthesia.   TOURNIQUET TIME:  Two hours followed by a 20 minute or greater deflation  time, followed by reinsufflation for less than 40 minutes.  The knee  tourniquet time was less than 30  minutes and was a separate tourniquet as  well.   DRAINS:  None.   SPECIMENS:  Multiple were sent for path.   INDICATIONS FOR PROCEDURE:  [name] is a 77 year old male who presents  with the above mentioned diagnoses.  I have counseled him regarding the  risks and benefits of surgery including risk of infection, bleeding,  anesthesia, damage to normal structures and failure of  surgery to accomplish  it intended goals of relieving symptoms and restoring function.  With this  in mind, he decides to proceed.  All questions had been encouraged and  answered preoperatively.   The patient understands the preoperative and postoperative routine,  necessary therapy and other measures.  He notes there were no guarantees in  terms of function but that the goals are going to be to try and give him an  improved range of motion, stability and function in the hand.  I have  discussed with him these issues at length, using notes, etc.   DESCRIPTION OF PROCEDURE:  The patient was seen by myself and anesthesia,  taken to operating suite, laid supine.  Foley catheter, prepped and draped  in usual sterile fashion and was given a gram of Ancef preoperatively for  antibiotic prophylaxis.  Once this was done, the patient then had left upper  extremity isolated, prepped and draped in the usual sterile fashion.  Once  this was done, the patient had a midline incision made over the index  finger  MCP joint, dissection was carried down. The patient had an interval between  the EIP and EDC split.  Capsule was incised and later repaired.  Once this  was done, the patient had the joint exposed.  Synovectomy was performed.  The proximal phalanx was treated with oscillating saw as was the distal  portion of the metacarpal.  These were made with guides for the [facility] implants.  I then broached up to a 30 about the proximal phalanx  and subsequently in the metacarpal.  This was done under x-ray to make sure  that proper position was obtained.  Following this, I placed trials.  All  looked well.  I then preplaced a radial collateral ligament suture.  This  was placed nicely and went through the bone in the metacarpal.  Following  this, I irrigated copiously.  Synovectomy was completed nicely. I did not  have to perform an intrinsic release and I then performed placement of the  PyroCarbon implants followed by repair of the radial collateral ligament  with FiberWire suture of the 4-0 variety followed by capsular repair and  repair of the tendon.  The area was imbricated slightly so that there would  be no ulnar translation of the extensor apparatus.  Following this, a  similar midline incision was made overlying the middle finger.  Dissection  was carried down.  I incised off of the radial portion of the extensor  tendon, dissected down, incised the capsule.  Performed synovectomy and then  prepared the bones according to standard protocol.  I then placed a 30 trial  in and following this, performed an intrinsic release as the patient did  have some noted tightness ulnarly. Following this, the implant sat nicely.  Thus, an intrinsic tendon release is accomplished about the middle finger  without difficulty.  This aligned him quite nicely.  The collateral ligaments were preserved.  I then seated the final implants from [employer].  These were PyroCarbon  implants without difficulty.  The area  was copiously lavaged without problems and following this, I then repaired  the capsule followed by repair of the interval radially and reefed the  extensor tendon so that there would be no ulnar subluxation of the tendon.  The patient tolerated this well.  This was in essence an extensor  realignment procedure of course.  This was also performed in the index  finger.  Following this, a midline incision was made over the ring and small  fingers.  Dissection was carried down.  The interval between the EDM and EDC  on the small finger was crated, and an interval off of the radial portion of  the extensor apparatus was created about the ring finger.  Synovectomy and  capsule incision was carried out followed by preparing the bony ends.  It  was quite clear that due to the preexisting deformity in the proximal  phalanx, the patient would not be a candidate in these joints for PyroCarbon  implants, thus we used a traditional silicon implant.  The patient had  preparation of the ring finger for a size 20 and small finger for a size 10  using broaches and standard technique including radiograph.  I did perform  an intrinsic release about the ring and small finger which was necessary to  prevent ulnar translation and subluxation.  This was done to my satisfaction  and I was pleased with this.  The patient had no problems with this.  There  were no problems with the trial implants and following this, using a no-  touch technique,the areas were irrigated and the implants placed, size 20 in  the ring, size 10 in the small finger.  I then closed the capsule,  imbricated and performed an extensor tendon realignment without difficulty  about the small and ring finger.  The patient tolerated this well without  complicating features.  Once this was done, I then made an incision about  the wrist and identified the ECRL and ECRB tendons.  I Z lengthened both of  these  tendons without difficulty along a 3 cm length.  The patient tolerated  this well.  This was done of course in an effort to prevent ulnar  translation of the carpus.  The patient tolerate this procedure well and  there were no complicating features once this was performed.  Following  this, I then deflated the tourniquet at two hours.  The areas were copiously  lavaged and closed.  There were no complicating features with this.  After  closure, I then reinflated the tourniquet and performed a removal of three  separate thumb masses.  These were ulnar in location.  One was in the pulp.  One was in the area just proximal to the IP joint and one was in the area  overlying the MCP joint.  Dissection was carried down through all incision  through skin with knife blade.  Subcutaneous dissection was carried out.  Circumferential dissection of three different distinct masses was accomplished taking care to protect the ulnar digital nerve.  Following  this, these were irrigated and closed.  I then placed the elbow in an  elevated position and performed a posterior midline incision, dissected down  and removed a very large portion of the ulnar bursa and a rheumatoid nodule  which was sent for specimen.  The ulnar nerve was protected at all times to  my satisfaction without difficulty and I then irrigated, dropped the  tourniquet and closed the wound in layers of Vicryl followed by Prolene.  Once this was done, the patient was placed in rheumatoid dressing to keep  the wrist in ulnar deviation and keep the fingers in a radial deviated  posture in extension.  The thumb was allowed to lay free.  The patient had  excellent refill and there were no complicating features and no evidence of  compartment syndrome.  After this was  placed and the splint was allowed to  cure, I then performed sterile prep and drape to the left knee.  We then  incised the knee medially through a 1 to 1.5 inch incision, dissected  down  to the retinaculum which was opened.  I then opened the capsule and removed  a very large intra-articular loose body which was causing the patient  mechanical symptoms.  I then irrigated the joint copiously and closed the  capsule followed by the retinaculum with 0 Vicryl followed by subcutaneous  with Vicryl and skin edge with Prolene type material.  The patient tolerated  this well.  There were no complicating features.  Once this was done, the  patient was sterilely dressed about the knee.  He was then awakened from  anesthesia and taken to the recovery room.  I should note that conclusion of  the knee procedure, which was the last procedure to be performed, the  patient was given Ancef with the tourniquet down.  This was his second dose.  I should note that the patient had excellent refill in all extremities.  He  looked well.  There was no evidence of compartment syndrome, DVT or other  problems.  He will be monitored in the recovery room.  He will be discharged  home once appropriate but will be, of course, admitted for IV antibiotics,  pain control, elevation and other measures.  I have discussed with the  patient  and his family do's and don'ts, etc. and they  understand the  postoperative protocol.       WMG/MEDQ  D:  09/30/2003  T:  10/01/2003  Job:  [id number]

## 2010-05-19 NOTE — Op Note (Signed)
[name], [name]              ACCOUNT NO.:  [id number]   MEDICAL RECORD NO.:  [id number]          PATIENT TYPE:  INP   LOCATION:  2550                         FACILITY:  [facility]   PHYSICIAN:  Dionne Ano. Gramig III, M.D.DATE OF BIRTH:  20-Sep-1932   DATE OF PROCEDURE:  04/13/2005  DATE OF DISCHARGE:                                 OPERATIVE REPORT   PREOPERATIVE DIAGNOSIS:  Rheumatoid arthritis, with multiple deformities  about the hands, arms, and legs.  Specifically, this patient presents for  elbow mass removal x3, left thumb extensor pollicis longus lengthening, and  synovectomy of the interphalangeal joint with manipulation.  He also  presents for hardware removal about the index finger and small finger, index  finger distal interphalangeal fusion, and middle finger tenolysis and  manipulation, as well as left small finger mass removal.  The patient  understands and accepts the risks and benefits and desires to proceed.   POSTOPERATIVE DIAGNOSIS:  Rheumatoid arthritis, with multiple deformities  about the hands, arms, and legs.  Specifically, this patient presents for  elbow mass removal x3, left thumb extensor pollicis longus lengthening, and  synovectomy of the interphalangeal joint with manipulation.  He also  presents for hardware removal about the index finger and small finger, index  finger distal interphalangeal fusion, and middle finger tenolysis and  manipulation, as well as left small finger mass removal.  The patient  understands and accepts the risks and benefits and desires to proceed.   PROCEDURES:  1.  Elbow mass removal x3 via two separate incisions.  2.  Left thumb extensor pollicis longus Z lengthening.  3.  Left thumb synovectomy of the IP joint, with manipulation under      anesthesia (this was an arthrotomy with synovectomy) left thumb.  4.  Left index finger hardware removal about the proximal interphalangeal      joint.  5.  Left index finger distal  interphalangeal joint fusion with autologous      bone grafting obtained from bony spurs.  6.  Left middle finger tenolysis, tenosynovectomy, and manipulation of the      joint under anesthesia.  7.  Manipulation of the PIP joint left middle finger under anesthesia.  8.  Left small finger hardware removal in the form of an AccuTrak screw.  9.  Left small finger mass removal about the hand/volar MP region.  10. Stress radiography.   SURGEON:  Dionne Ano. Amanda Pea, M.D.   ASSISTANT:  Karie Chimera, P.A.-C.   COMPLICATIONS:  None.   ANESTHESIA:  Axillary block, with IV sedation.   ESTIMATED BLOOD LOSS:  Minimal.   COMPLICATIONS:  None.   TOURNIQUET TIME:  Less than 2 hours.   DRAINS:  None.   INDICATIONS FOR THE PROCEDURE:  [name] is well known to myself.  He is a  77 year old male who presents with multiple rheumatoid arthritic deformities  and the above-mentioned diagnoses.  He understands and accepts the risks and  benefits of surgery and desires to proceed.  He understands the possible  need for thumb IP fusion will be entertained if his lysis of adhesions  and  manipulation is not successful.  He and I have discussed these issues at  length, dos and don'ts, etc., and all questions have been encouraged and  answered.   OPERATION IN DETAIL:  The patient was seen by myself and anesthesia.  Dr. Germaine Pomfret presided over a block in the holding area, which worked  nicely.  He was taken to the operative suite and given preoperative  antibiotics, laid supine, appropriately padded, and prepped and draped in  usual sterile fashion with Betadine scrub and paint.  Once antibiotics were  in, the patient had the arm elevated.  Tourniquet was insufflated to 250  mmHg, and under a sterile field a posterior incision about the elbow was  made.  This was carried down.  Skin flap elevated nicely.  I then dissected  out two different and distinct masses, removed a bony spur deep in  nature,  and placed bone wax against the bony spur removal.  I then closed the  triceps fascia which was split for bony spur removal with Vicryl.  Following  this, I tacked the subcutaneous down to the olecranon region to prevent  hematoma formation.  This was done with the tourniquet down at a later  procedure during the procedure of course.  The wound was closed with a  mattress suture of 3-0 Prolene.  The patient tolerated this well, and there  were no complicating features.   Once this was done, a separate incision was made about the lateral aspect of  the elbow.  Dissection was carried down through a 1.5 cm incision, and a  deep mass, consistent with rheumatoid nodule, was removed.  This was the  third elbow mass removed and was done without difficulty.  This was a deep  mass removal. This incision was irrigated, closed, and looked well, with  excellent hemostasis after the tourniquet was deflated later in the case of  course.   Once this was done, attention was turned toward the left thumb.  A  curvilinear incision was made.  Dissection was carried down.  The EPL was Z  lengthened.  I then opened the joint and performed a synovectomy and partial  collateral ligament release with manipulation of the joint.  This placed the  patient out of his very hyperextended position into a more neutral position  and one that allowed some degree of flexion.  The patient tolerated this  well.  Thus, Z lengthening of the EPL tendon and synovectomy of the IP joint  with arthrotomy was accomplished.  The Z lengthened tendon was repaired with  fiber wire suture.  The wound was closed once hemostasis was obtained later  in the case with 4-0 Prolene.  Following this, attention was turned toward  the index finger.  Curvilinear incision was made along the previously made  outline marks and prior scars.  Dissection was carried.  Skin flap was elevated.  PIP hardware removal was accomplished without  difficulty,  removing an AccuTrak screw.  This was a hardware removal at the proximal  interphalangeal joint.  Once this was done, the patient then underwent  removal of a bony spur in this region.  This was filed down nicely and the  interval closed with Vicryl.  Following this, I then opened in a V-Y fashion  the extensor apparatus distally, opened the DIP joint up, and with cup and  cone preparation prepared the area for a DIP fusion.  I then placed a 0.035  K-wire, buried  this nicely underneath the pulp to provide fixation for the  fusion.  The patient had excellent coaptation of cancellous surfaces, and  autologous bone graft was placed.  This completed the DIP fusion.  Stress  radiography was accomplished to verify correct position.  I was pleased with  this and the findings, and should note that the PIP fusion looked excellent  by my objective exam.   Once this was done, I then moved on to the middle finger.  Tenolysis was  accomplished via an extensile incision dorsally along the previously made  outline marks and scar lines.  Dissection was carried.  V-Y length and  extensor apparatus was allowed with dissection technique.  I then shotgunned  open the joint very carefully and meticulously and released the very top  portion of the proper collateral ligament, taking care to hold the accessory  collateral ligaments intact.  The patient tolerated this well and had  excellent restoration of flexion capabilities passively.  Following this, I  then performed a manipulation of anesthesia of the middle finger.  The joint  looked excellent.  The patient's hardware from prior PIP arthroplasty was  intact, and there were no complicating features.  I was pleased with this  and the findings.  I then irrigated copiously, lengthened the extensor  apparatus in a V-Y lengthening fashion to my satisfaction, and sutured this  with fiber wire.  The wound was then irrigated copiously, and it was  closed  later in the case with the tourniquet deflated and hemostasis obtained.  I  was able to demonstrate good passive motion.  Hopefully, this will restore  flexion capabilities nicely for Mr. Navarrete.   Following tenolysis, tenosynovectomy, and manipulation under anesthesia of  the middle finger, I then turned attention toward the small finger.  A PIP  incision was made.  Dissection was carried, and hardware removal was carried  out deeply.  This was in the form of an AccuTrak screw.  The patient  tolerated this well, and there were no complicating features.  The PIP joint  was evaluated and was noted to be stable, without signs of nonunion or  fibrous union.  Once this was done, I then performed a separate incision  volarly over the volar MP region.  Dissection was carried down.  Ulnar  distal nerve was identified and protected, and I removed a deep mass  consistent with a rheumatoid nodule.  The patient tolerated this well, and there were no complicating features.  Once this was done, I then performed a  final stress radiography, followed by tourniquet deflation.  Once this was  done, we then irrigated copiously and made sure all wounds were closed, with  hemostasis being complete.  I was with this and the findings.  Mr. Botero was  taken to the respiratory rate after dressing was applied.  I placed a  compressive dressing against the elbow after a sterile dressing was applied  in the form of an Ace wrap and a plaster splint with dorsal and volar slabs  about the hand.  I kept the fingers in a somewhat curled position to  maximize his postop recover.  I will admit him for IV antibiotics, pain  control, and general postoperative management.  I have discussed with him  dos and don'ts, etc., and all questions have been encouraged and answered.  It has been an absolute pleasure to treat Mr. Diemer, and we look forward to  participating in his postoperative recovery.  ______________________________  Dionne Ano. Everlene Other, M.D.     [name]  D:  04/13/2005  T:  04/13/2005  Job:  [id number]

## 2010-05-19 NOTE — Discharge Summary (Signed)
[name], Mccann              ACCOUNT NO.:  [id number]   MEDICAL RECORD NO.:  [id number]          PATIENT TYPE:  INP   LOCATION:  5035                         FACILITY:  [facility]   PHYSICIAN:  Dionne Ano. Gramig, M.D.DATE OF BIRTH:  01-Jan-1933   DATE OF ADMISSION:  01/25/2005  DATE OF DISCHARGE:  01/28/2005                                 DISCHARGE SUMMARY   ADMITTING DIAGNOSIS:  Rheumatoid arthritis with multiple reconstructive  efforts to bilateral hands, multiple rheumatoid nodule removals both upper  and lower extremities.   DISCHARGE DIAGNOSIS:  Rheumatoid arthritis with multiple reconstructive  efforts to bilateral hands, multiple rheumatoid nodule removals both upper  and lower extremities.   SURGEON:  Dionne Ano. Amanda Pea, M.D.   CONSULTS:  None.   BRIEF HISTORY OF PRESENT ILLNESS:  [name] is an extremely pleasant 77-  year-old gentleman who is well known to [facility].  He  has had multiple reconstructive efforts to his bilateral hands and multiple  mass removals to the upper and lower extremities as he has a significant  history of rheumatoid arthritis.  He presents for further reconstructive  efforts as well as tenolysis about the digits of the right hand and a  rheumatoid nodule mass removal about the right foot heel.  Preoperatively,  appropriate labs, EKG, and chest x-ray were obtained and were satisfactory.   HOSPITAL COURSE:  [name] was admitted on January 25, 2005, and underwent  a right small finger, right index finger tenolysis and capsular release, as  well as a right small finger manipulation under anesthesia as well as a  right small index finger manipulation under anesthesia.  In addition, he  underwent a right ring finger MC arthroplasty using a New Flex size 40 and  foot mass removal to the right lower extremity was performed x2.  He  tolerated the procedure extremely well.  Please see the full details in the  operative report.   There were no complications and he was admitted under  standard postoperative orders including IV antibiotics in the form of Ancef  and pain management.  He was doing extremely well postoperative day #1, he  had no complaints.  He was alert and oriented.  His vital signs were stable.  He was neurovascularly intact to the upper and lower extremity.  His  dressings were clean, dry, and intact.  He was noted to have a traumatic  injury to the left knee approximately 3 weeks prior to his surgery and on  examination he was noted to have an effusion and plans were made to perform  an aspiration at bedside followed with a corticosteroid injection.  The  following day the patient was doing well.  Plans were made to discharge him  home for the next day.  The left knee was aspirated with 35 mL of yellow  fluid obtained followed by Depo-Medrol injection.  Overall he was stable and  doing well. Postoperative day #3 he was without complaints, his vital signs  were stable, and he was eager for discharge.   ASSESSMENT/FINAL DIAGNOSIS:  See discharge diagnoses.   PLAN:  Condition on discharge improved.  Diet is regular.  Activity:  He  will keep his dressings clean, dry, and intact to the upper and lower  extremity.  He will be cautious weightbearing in a postoperative shoe to the  right lower extremity.   DISCHARGE MEDICATIONS:  1.  Percocet 5/325 one to two p.o. q.4-6h. p.r.n. pain.  2.  Robaxin 500 mg one p.o. q.6h. p.r.n. spasm.  3.  Over-the-counter Peri-Colace.   He will follow up tomorrow morning at 10 a.m. for a wound check and dressing  changes.      Karie Chimera, P.A.-C.    ______________________________  Dionne Ano. Amanda Pea, M.D.    BB/MEDQ  D:  05/10/2005  T:  05/11/2005  Job:  [id number]

## 2010-05-19 NOTE — Op Note (Signed)
NAMESAHID, BORBA              ACCOUNT NO.:  [id number]   MEDICAL RECORD NO.:  [id number]          PATIENT TYPE:  INP   LOCATION:  2858                         FACILITY:  [facility]   PHYSICIAN:  Dionne Ano. Gramig III, M.D.DATE OF BIRTH:  08-29-1932   DATE OF PROCEDURE:  02/24/2004  DATE OF DISCHARGE:                                 OPERATIVE REPORT   PREOPERATIVE DIAGNOSIS:  Severe rheumatoid arthritis with noted left elbow  large rheumatoid nodule over the olecranon region with bursal fluid  accumulation and active bursitis, left elbow radial head deformity with  advanced radiocapitellar degenerative change and loss of elbow range of  motion.  Left hand rheumatoid arthritis deformity status post MCP  replacement.  This patient presents with left index finger mass removal,  left index finger PIP fusion as well as small finger PIP fusion, and left  middle finger arthroplasty, left thumb mass removal x 2, mass removal in his  foot deeply located x 3.   POSTOPERATIVE DIAGNOSIS:  Severe rheumatoid arthritis with noted left elbow  large rheumatoid nodule over the olecranon region with bursal fluid  accumulation and active bursitis, left elbow radial head deformity with  advanced radiocapitellar degenerative change and loss of elbow range of  motion.  Left hand rheumatoid arthritis deformity status post MCP  replacement.  This patient presents with left index finger mass removal,  left index finger PIP fusion as well as small finger PIP fusion, and left  middle finger arthroplasty, left thumb mass removal x 2, mass removal in his  foot deeply located x 3.   PROCEDURE:  1.  Resection left radial head at the elbow region.  2.  Bursectomy left elbow with complex wound closure and removal of a large      bursa.  3.  Removal of deep thumb masses x 2 with two separate incisions.  4.  Removal of deep mass left index finger about the volar radial aspect.  5.  Left index finger proximal  interphalangeal joint fusion with a mini-      AccuTrak fusion screw.  6.  Left small finger PIP fusion with mini-AccuTrak fusion screw.  7.  Left middle finger PIP joint arthroplasty with size 30 proximal and size      20 distal carbon implant.  8.  Removal of deep soft tissue foot masses x 3 about the foot.  9.  Stress radiography.   SURGEON:  Dionne Ano. Amanda Pea, M.D.   ASSISTANT:  Karie Chimera, P.A.-C.   COMPLICATIONS:  None.   ANESTHESIA:  General.   SPECIMENS:  Multiple.   ESTIMATED BLOOD LOSS:  Minimal.   TOURNIQUET TIME:  Less than 30 minutes for the elbow portion of the  procedure and less than two hours for the hand portion of the procedure,  greater than 20 minute deflation time between tourniquets.  The foot  tourniquet was on approximately 20  minutes.   INDICATIONS FOR PROCEDURE:  This patient is a very pleasant 77 year old male  who presents with the above mentioned diagnosis.  I have counseled him in  regards to the risks and benefits  of surgery including the risks of  infection, bleeding, anesthesia, damage to normal structures, and failure of  surgery to accomplish the intended goals, with this in mind, he desires to  proceed, all questions have been encouraged and answered preoperatively.  He  specifically understands the postoperative protocol, risks and benefits  including dystrophy, infection, poor wound healing given his rheumatoid  status.  With these findings, he is asked to proceed.   PROCEDURE IN DETAIL:  The patient was seen by myself and anesthesia, taken  to the operative suite, he was given prophylactic antibiotics in the form of  Ancef, he was given a Foley catheter, once asleep under general anesthesia.  General anesthetic was induced by Dr. Sampson Goon.  Once this was done, he  was appropriately padded and we prepped and draped the left upper extremity  with Betadine scrub and paint x 2 and the left foot with Betadine scrub and  paint followed  by DuraPrep.  Once a sterile field was secured, the left  upper extremity was elevated, tourniquet was inflated to 250 mmHg.  An  incision was made about the olecranon region, dissection was carried down  and a very large ulnar bursa was identified and removed.  The patient had  small bony osteophyte which was removed to my satisfaction without  difficulty.  Following removal of this, I then performed palpation of the  ulnar nerve which was intact.  The patient had this removed without  difficulty.  The bony spur was filed down and following this, a very complex  wound closure was accomplished taking the skin against the ulnar boarder in  a flexed position so that hopefully he will not reaccumulate the bursa and  remake this.  Following this, I then performed an incision over the radial  capitellar joint with the arm pronated, dissection was carried down, the  interval between the ACU and anconeus was created.  I then dissected down to  the radial head just at the neck level.  I placed two Bennett retractors  keeping the elbow fully pronated to prevent injury to the posterior  interosseous nerve.  I then performed an oscillating saw cut and removed the  radial head.  I then debrided the joint and performed a limited synovectomy.  He tolerated this well without difficulty.  Following this, the patient then  underwent a complex closure with FiberWire.  I should note that he had a  functional range of motion and that the proximal radial ulnar joint was free  from synostosis or other problems.  I performed a stress test which revealed  no obvious incompetency of the interosseous membrane.  I was pleased with  these findings.  I then deflated the tourniquet and closed the wounds.  This  was done to my satisfaction without difficulty with a combination of 3-0  Vicryl followed by 4-0 Prolene used for the complex wound closures.  Attention was turned towards the foot, the tourniquet was elevated.   An  incision was made over the first MTP joint in the plantar region.  This was  a longitudinal incision.  A skin flap was elevated.  Some very thin skin was  removed and excised and following this, I removed a very large rheumatoid  nodule approximately 2 by 2.5 cm.  This was removed meticulously and  following this, I copiously irrigated and obtained hemostasis with bipolar  electrocautery.  Following this, I performed an incision over a large  rheumatoid nodule over the fifth MTP joint.  A similar longitudinal incision  was made.  Deep dissection was carried down and this nodule was removed to  my satisfaction.  Following this, a large nodule off the heel was removed to  my satisfaction.  This was once again, very large and went down to nearly  the periosteal region.  I took care to preserve the sensory nerve branches  and removed and eliminated the large rheumatoid  nodule in full.  Following  this, I deflated the tourniquet placed at mid calf area, copiously  irrigated, nd closed the wounds with complex closure.  I will keep him non-  weight bearing.  He had excellent refill, no complicating features, and  looked quite well.   Attention was turned towards the upper extremity once again.  The tourniquet  was reinflated.  I then performed an incision about the thumb volar  radially.  This was at the distal pulse level.  Circumferential dissection  of the deep mass 2 by 5 cm was carried out and this was removed to my  satisfaction, taking care to preserve the sensory nerve branches.  Following  this, over the proximal phalanx volar radial region, another deep mass was  removed through a 2 cm incision.  Dissection was carried out deeply and this  was removed to my satisfaction.  I did remove a small bit of a bony spur  here, as well.  This will have a tendency to recur in my opinion due to the  bony spur.  Following this, I copiously irrigated and closed the wounds.  Once this was done,  I then performed  an additional removal of the mass  about the volar radial aspect of the left index finger.  Dissection was  carried down about the pulp and a nodule 5 by 4 mm was removed.  Care was  taken to avoid injury to the sensory nerve branch (radial sensory nerve).  I  made a dorsal incision over the PIP joint of the index ringer, dissected  down, opened the joint,  shot gunned this open and prepared the surfaces.  I  placed a guide-wire for an AccuTrak screw and under stress radiography  placed the AccuTrak screw after the bony ends were prepared.  He had  excellent fit and bite and I was pleased with this.  I placed him at 45  degrees of flexion according to standard fusion protocol in my opinion.  He  tolerated this well.  Once this was done, I then repaired the extensor  tendon with FiberWire and then closed the dorsal wound.  I copiously  irrigated.  A similar incision was made about the small finger, dissection was carried down, the extensor tendon was opened in a V-shaped fashion, it  was shot gunned open, once this was done, I then denuded the degenerative  cartilaginous surfaces.  I then placed a guide-wire for the micro-AccuTrak  screw.  This was done to my satisfaction.  This screw, of course, was placed  intramedullary.  It was a size 13 screw and it was placed over the guide-  wire and had excellent bite and I was pleased with the amount of flexion  which was approximately 45 degrees.  I performed an incision dorsally about  the middle finger, dissection was carried down, incision of the extensor  apparatus was created, turned this down and then prepared the proxima  phalanx.  I removed osteophytes and then made step cuts with intramedullary  awl utilized for proper cut technique.  I then broached proximally up to a  size 30 and then placed the volar cutting device and with oscillating saw  cut the volar condyles.  I adjusted the cuts accordingly and then placed the   trial.  This looked well.  I did check all these steps under x-ray to make  sure it all looked well. I was pleased wit this and the findings.  I then  prepared the middle phalanx.  Under standard stress radiography, I broached  up to a size 20.  Following broaching up to a size 20, I then placed the  trial implants, all looked quite well.  I checked this under radiograph and  then copiously irrigated followed by placement of the final implants.  The  final implants were placed to my satisfaction without difficulty and once  final implants were placed, I then tightened the collateral ligaments up  with FiberWire as necessary.  In addition to this, closed the extensor  mechanism in a slightly VY lengthened state due to the Chammay exposure.  This was closed with FiberWire.  I deflated the tourniquet, copiously  irrigated about the dorsal incisions of the index, middle, and small finger,  and then made sure that the closure with FiberWire over the extensor  apparatus was performed without difficulty and then closed the wounds.  The  wounds were closely nicely, I was pleased with this and the findings.  I  then placed Xeroform followed by Adaptic and a sterile dressing keeping the  fingers in slight flexion.  The patient tolerated this well.  I placed a  nice dressing on without difficulty and converted this to a long arm splint.  The elbow was placed at 90 degrees, it was well padded, and long arm splint  was placed.  The patient tolerated the procedure well, there were no  complications.  The foot was dressed in a similar fashion.  He had excellent  refill, no signs of compartment syndrome or other problems.  He was  extubated, taken to the recovery room.  He will be given immediate  antibiotics, pain control, and will be observed in the hospital.  We will  keep him non-weight bearing in the foot and monitor his status closely.  It  has been an absolute pleasure to see and treat him.  Stress  radiography was performed at all times during the case to insure proper position of the  plate and screws in the index and small finger as well as proper placement  of the implants in the middle finger.  The MCP replacement looked quite well  and I did four of these to make sure there were no complicating features  with this.  He was stable, awake, alert and oriented in the recovery room to  be monitored closely.      WMG/MEDQ  D:  02/24/2004  T:  02/24/2004  Job:  [id number]

## 2010-05-19 NOTE — Discharge Summary (Signed)
[name], [name]              ACCOUNT NO.:  [id number]   MEDICAL RECORD NO.:  [id number]          PATIENT TYPE:  INP   LOCATION:  5034                         FACILITY:  [facility]   PHYSICIAN:  Dionne Ano. Gramig, M.D.DATE OF BIRTH:  15-Dec-1932   DATE OF ADMISSION:  02/24/2004  DATE OF DISCHARGE:  02/27/2004                                 DISCHARGE SUMMARY   ADMISSION DIAGNOSIS:  Significant rheumatoid arthritis with left hand,  elbow, and foot involvement.   DISCHARGE DIAGNOSIS:  Significant rheumatoid arthritis with left hand,  elbow, and foot involvement, improved.   SURGEON:  Dionne Ano. Amanda Pea, M.D.   CONSULTATIONS:  None.   PROCEDURE:  1.  He has resection left radial head about the elbow, bursectomy left      elbow, flexion and closure and removal of a large bursa.  2.  Removal of deep thumb masses x2 with separate incisions.  3.  Removal of deep mass left index finger at volar radial apex.  4.  Left index finger PIP joint effusion with mini AccuTrack fusion and      screw.  5.  Left small finger PIP fusion with mini AccuTrack screw.  6.  Left middle finger PIP joint sized to a 30 proximal and size 20 distal      with carbon implants.  7.  Removal of deep soft tissue foot masses x3 about the left foot.   HISTORY OF PRESENT ILLNESS:  [name] is a pleasant gentleman who is 77  years of age. He has a known history of significant rheumatoid arthritis  infecting the upper extremities with noted deformities on examination. He  has previously underwent reconstruction about the right hand and has faired  quite well. He presents for evaluation of his left upper extremity and left  foot as he is noted to have severe rheumatoid arthritic changes about the  hand, elbow and foot.   HOSPITAL COURSE:  [name] was admitted on February 24, 2004 and underwent  the above procedures without difficulty. He tolerated these very well. He  was admitted to the orthopedic unit for  standard postoperative antibiotics,  IV pain management, close observation, and wound care.   Postoperative day one, the patient was doing very well. He had postoperative  tenderness. He denied numbness, tingling, shortness of breath, chest pain,  nausea, or vomiting. He was tolerating p.o.'s quite well. His vital signs  are stable. He is afebrile. His left upper extremity dressings were clean,  dry and intact. His sensation was intact and refill was intact. His drains  were removed without difficulty. Examination of the left lower extremity  shows dressings were clean, dry and intact. Sensation was intact. Refill was  intact. Chest clear to auscultation. Heart is S1 and S2. Abdomen is soft,  nontender. Bowel sounds were hypoactive. OT/PT were consulted and followed  close through interim of wound care and his ADLs. He was noted to have  excellent family support and required no home health care, and in fact  refused this.   Postoperative day two, he was without complaints. He was afebrile. Vital  signs were stable. His examination was unchanged on postoperative day three.  He was doing better in terms of his pain. His vital signs were stable and he  was afebrile. He was alert and oriented. He was neurovascularly intact. His  wounds were intact without signs of infection or discs neurovascularly  compromised. Intraoperative tissue specimens were obtained and pathology  reports were consistent with dense fibrous tissue consistent with a synovial  cyst about the left elbow bursa. Pulsating granulomatous inflammation  consistent with rheumatoid nodules which were found about the mass removal  of the foot as expected.   ASSESSMENT:  A 77 year old gentleman with a history of rheumatoid arthritis  effecting bilateral extremities with no rheumatoid nodules about the lower  extremities with noted deformities requiring reconstruction.   CONDITION ON DISCHARGE:  Improved.   DIET:  Regular.    ACTIVITY:  He will keep his dressings clean, dry and intact. He is  nonweightbearing to the lower extremities and to the left upper extremity as  well. He will elevate frequently the lower and upper extremity. He will call  for any questions or concerns.   DISCHARGE MEDICATIONS:  1.  Percocet 1-2 p.o. q.4-6h. p.r.n. pain.  2.  Robaxin 1 p.o. q.6-8h. p.r.n. spasm.   FOLLOW UP:  He will follow up in the office in 10 days and call [phone] to  schedule an appointment time.      BB/MEDQ  D:  05/23/2004  T:  05/24/2004  Job:  [id number]

## 2010-05-19 NOTE — Op Note (Signed)
NAME:  [name], [name]                        ACCOUNT NO.:  [id number]   MEDICAL RECORD NO.:  [id number]                   PATIENT TYPE:  OIB   LOCATION:  2853                                 FACILITY:  [facility]   PHYSICIAN:  Dionne Ano. Everlene Other, M.D.         DATE OF BIRTH:  1932-08-17   DATE OF PROCEDURE:  04/27/2003  DATE OF DISCHARGE:                                 OPERATIVE REPORT   PREOPERATIVE DIAGNOSES:  1. Rheumatoid arthritis with severe involvement of the proximal     metaphalangeal joints and metacarpal phalangeal joints in the right and     left hands.  The patient presents for right PIP arthroplasty     reconstruction of the second through fifth PIP joints.  2. Rheumatoid nodules about the elbow and forearm region x2 as well as the     thumb x3.  3. Painful rheumatoid nodules about the foot x2 in the forefoot region.     This is plantar in location.   POSTOPERATIVE DIAGNOSES:  1. Rheumatoid arthritis with severe involvement of the proximal     metaphalangeal joints and metacarpal phalangeal joints in the right and     left hands.  The patient presents for right PIP arthroplasty     reconstruction of the second through fifth PIP joints.  2. Rheumatoid nodules about the elbow and forearm region x2 as well as the     thumb x3.  3. Painful rheumatoid nodules about the foot x2 in the forefoot region.     This is plantar in location.   PROCEDURE:  1. Removal of deep thumb mass volar ulnarly x2 in two separate locations.  2. Removal of dorsoradial thumb mass deep in location x1.  3. Removal of elbow forearm masses deep in location x2.  4. Removal of two separate large deep masses about the right foot in the     forefoot region plantarly.  5. Arthroplasty right index finger proximal metaphalangeal joint with 30     proximal and 20 distal PyroCarbon implant.  6. Arthroplasty proximal phalangeal joint right middle finger with 30     proximal and 20 distal PyroCarbon  implant.  7. Arthroplasty proximal phalangeal joint right ring finger with size 30     proximal implant.  8. Arthroplasty right small finger proximal phalangeal joint with size 20     proximal component about the small finger.  9. Stress radiography.  10.      Neurolysis ulnar digital nerve right thumb.   SURGEON:  Dionne Ano. Amanda Pea, M.D.   ASSISTANT:  Karie Chimera, P.A.-C.   COMPLICATIONS:  None.   ANESTHESIA:  General.   TOURNIQUET TIME:  Just less than an hour about the right upper extremity and  less than 30 minutes about the right foot.   DRAINS:  None.   ESTIMATED BLOOD LOSS:  Minimal.   INDICATIONS FOR PROCEDURE:  The patient is a 77 year old male who presents  with the above mentioned diagnoses.  I have counseled him regarding the  risks and benefits of surgery including risk of infection, bleeding,  anesthesia, damage to normal structures, and failure of surgery to  accomplish its intended goals of relieving symptoms and restoring function.  This patient has severe arthritic deformity and has difficulty with flexion  and extension of the PIP joints and has significant MCP disease as well.  He  is status post prior total wrist arthroplasty in the past.  This is all well  documented in my notes.  I have discussed with the patient these findings  and treatment options.  I have discussed the risks and benefits of surgery  including risk of infection, bleeding, anesthesia, damage to normal  structures, and failure of surgery to accomplish its intended goals of  relieving symptoms and restoring function.  At this time he desires to  proceed.  All questions have been encouraged and answered.  The patient's  painful foot nodules have been evaluated by myself multiple times in the  past.  Leonides Grills, M.D. has seen him as well.  The patient desires to have  these removed and I discussed the risks of painful incisions, loss of  sensory and nerves in this region, etc.  At  this time, he desires to  proceed.   DESCRIPTION OF PROCEDURE:  The patient was seen by myself and anesthesia and  taken to the operating room and underwent smooth induction of anesthesia and  given preoperative Ancef.  Following this he was laid supine, properly  padded, and prepped and draped about the right upper extremity.  The right  upper extremity was prepped and draped in the usual sterile fashion with  Betadine scrub and paint.  Following this, the right lower extremity was  prepped and draped in a similar fashion.  Once a sterile field was secured,  the right arm was elevated and the tourniquet was inflated to 250 mmHg and  incision was made about the elbow under 250 mmHg of tourniquet control.  Dissection was carried out through the skin.  Dissection was then  accomplished and the rheumatoid nodule overlying the olecranon region of the  ulna was then identified circumferentially and removed.  This was deep  dissection.  Care was taken to avoid the ulnar nerve.  The triceps  musculature was identified and protected.  Once this was removed, it was  sent for specimen.  Following this, the patient underwent dissection distal  to this with removal of a separate deep mass.  This was circumferentially  dissected about the region of the proximal olecranon and shaft.  This was at  the diametaphyseal flare region.  The bone was not encroached upon.  The  patient had the mass removed in its entirety protecting the ulnar nerve.  Following this, I turned our attention toward the dorsoradial thumb and  incision was made and a rheumatoid type nodule was dissected.  This was  removal of a deep mass about the thumb.  Care was taken to protect the  radiodigital nerve of the thumb.  It was protected at all times without  problems.  It was removed and sent for specimen.  Following this, an  incision longitudinal in nature was made volar ulnarly where two separate deep masses were identified and  removed.  The ulnar digital nerve was  identified about the thumb and this had a very large corpuscle attached to  it.  This did run through both of the masses  and at the trifurcation had  some significant abnormalities.  I did not sacrifice the nerve, but did  meticulously remove it from the mass and stuffed this out of the field's  way.  The corpuscle was seen to end with the nerve interestingly enough and  I could not tell if this was a previous traumatic neuroma versus just a very  large corpuscle.  In any event, I did not sacrifice this, but did stuff it  under the volar pad.  The masses were removed without difficulty.  This did  require some neurolysis of the ulnar digital nerve.  This was performed  without difficulty.  This was a distinct and separate portion of the case.   Once the masses were removed, the tourniquet was deflated and hemostasis was  obtained.  Copious irrigation was applied and the wounds were sutured with 4-  0 Prolene.  The wounds sutured together nicely at the elbow and thumb x2  incisions.  Once this was done, after 10 minutes of deflation time, the  tourniquet was reelevated.  The reelevation of the tourniquet was  accomplished without difficulty and the patient had dorsal incision made  about the middle finger.  Dissection was carried down.  Chevron incision was  made over the extensor tendon.  I then turned this down and exposed the  proximal metaphalangeal joint.  The proximal metaphalangeal joint was  identified.  Collateral ligaments were protected.  Awl was used in the  proximal phalanx.  Following this, the guide for oscillating saw and  proximal phalanx cut was made.  He was then sized up to a 30 and oblique cut  was made with the oscillating saw and the guide for the ascension PyroCarbon  implant.  I then trialed this with a 30, it looked excellent and did perform  a stress radiography during this to ensure correct placement of the cuts.  Following  this, the middle phalanx was prepared by utilizing K-wire  localization of the canal followed by reaming and broaching the canal to a  size 20.  The patient then had trial components placed and all looked quite  well.  I then placed the final components after irrigation.  I should note  that a meticulous synovectomy was performed during this with large amount of  degenerative rheumatoid tissue removed.  Osteophytes were removed as well.  Collateral ligaments were kept intact and I was very pleased with all  findings.  I then sutured with 4-0 FiberWire the extensor apparatus.  I  lengthened this slightly as the patient was fully extended and had  difficulty flexing preoperatively.  Following this, attention was turned  toward the index finger.  A similar incision was made.  The Chevron flap  turned down with the extensor apparatus.  I then shotguned the joint open  taking care to preserve the collateral ligaments and then made the proximal saw cut which was perpendicular to the shaft of the proximal phalanx.  With  cutting guide, I did broach to a 30 and following this, made the volar cut  with a guide for the proximal phalanx.  We then prepared the middle phalanx  in a standard fashion utilizing K-wire localization under fluoroscopy  followed by utilizing the bur, broach, and trial with 20 distal and 30  proximal temporary prostheses.  The trials looked excellent.  I then  implanted the PyroCarbon implants.  The PyroCarbon implants looked  excellent.  There was good stability.  Collateral ligaments were preserved.  Following this, we began irrigating copiously, took final x-rays, and  repaired the wound was a Chevron incision.  Once this was done, I then  turned attention toward the ring finger.  Dissection was carried down.  Chevron incision was extended and allowed for turn down at the extensor  apparatus.  This cut was at the finger with a slight amount of Swan  posturing preoperatively.   I performed a manipulation of the joint, turned  down the extensor region and following this, identified the joint and placed  an awl, followed by perpendicular cutting guide, followed by broaching and  the oblique cutting guide.  I then trialed.  The patient was trialed with a  30 proximal stem.  The middle phalanx articular surface fit perfectly and I  felt that the patient was an excellent candidate for hemiarthroplasty and  thus did not place the additional portion.  I was pleased with this and I  then took final x-rays, tested for stability once again, irrigated, and  closed the wound with the central slip lengthened somewhat creating a slight  Fowler relaxing moment to the suture apparatus to hopefully prevent any swan  posturing.  The patient had excellent passive flexion about the index,  middle, and ring finger and I was very pleased with this.  Once this was  done, the small finger was similarly incised, the extensor turned down in  [place] fashion, and following this the proximal phalanx was prepared and  sized to a 20.  the middle phalanx sat against the temporary prosthesis  wonderfully but given the small canal size of the middle phalanx, we opted  for hemiarthroplasty of the small finger as well.  The fit looked excellent.  The stability was nice and I was pleased with this.  Following this, I then  irrigated copiously and deflated the tourniquet.  The extensor apparatus  were all repaired to my satisfaction.  The patient then underwent closure of  the skin edge.  Hemostasis was maintained nicely and there were no  complicating features.  The patient had Xeroform followed by gauze followed  by Kerlix dressing applied.  The patient had a volar and dorsal plaster slab  applied correcting the ulnar deviating tendon seat of the MCP joints and  placing the PIP in just a slight bit of flexion, but nearly an extension.  Refill was excellent.  The patient was again fully extended in  the wrist. Ulnar deviation was corrected with splinting.  The elbow was dressed  sterilely.  There were no complicating features with the surgery.  All  needle, sponge, and instrument counts correct in regards to the right upper  extremity.  Once this was done, attention was turned toward the right foot.  Tourniquet was insufflated.  A midline incision was made between the 5th and  4th web space and dissection was carried down through the skin.  Meticulously with 15 blade knife, created a plane between the dermal tissue  and the nodule.  I then dissected deeply taking care to avoid sensory nerves  and arteries and removed the nodule which appeared to arise from the fourth  MCP region.  The stalk was removed.  The joint was not exposed.  This was  removed and sent for specimen.  This was separate from the right upper  extremity specimens.  Following this a midline incision was made in the area  about the first MTP joint about the right foot.  Dissection was carried down  and a  similar rheumatoid mass was removed.  This was a deep mass removal and  there was a second mass removal about the foot.  The patient tolerated this  well without complicating features.  I should note that the sensory nerve  branches and arterial branches were preserved while removing this mass and  it did appear to rise from the MTP joint volar region.  I did not encroach  upon the sesamoid apparatus, however.  Once this was done, I then deflated  the tourniquet, obtained hemostasis, irrigated copiously, and closed the  wounds with vertical mattress sutures similar to the elbow. This was then  dressed with forefoot dressing and Ace wrap was applied.  The patient  tolerated this well.  He was extubated and taken to the recovery room.  All  needle, sponge, and instrument counts correct.  He will be monitored for  postoperative pain management.  He will be given  IV antibiotics and appropriate postoperative measures.  I  have discussed all  issues with his wife, all dos and don'ts etc., and all questions have been  encouraged and answered.  It has been a pleasure to participate in his care  and we look forward to participating in his postoperative recovery.                                               Dionne Ano. Everlene Other, M.D.    Nash Mantis  D:  04/27/2003  T:  04/27/2003  Job:  [id number]

## 2010-10-06 LAB — CBC
HCT: 36.8 — ABNORMAL LOW
MCHC: 33.5
MCV: 80.6
WBC: 8.4

## 2010-10-06 LAB — WOUND CULTURE
Culture: NO GROWTH
Gram Stain: NONE SEEN

## 2010-10-06 LAB — BASIC METABOLIC PANEL
BUN: 11
Calcium: 8.7
Chloride: 102
GFR calc Af Amer: >60
Glucose, Bld: 106 — ABNORMAL HIGH

## 2010-10-06 LAB — URINALYSIS, ROUTINE W REFLEX MICROSCOPIC
Bilirubin Urine: NEGATIVE
Ketones, ur: NEGATIVE
Nitrite: NEGATIVE
Specific Gravity, Urine: 1.011
Urobilinogen, UA: 0.2

## 2013-05-11 ENCOUNTER — Ambulatory Visit: Payer: Self-pay | Admitting: Internal Medicine

## 2013-07-16 ENCOUNTER — Ambulatory Visit (INDEPENDENT_AMBULATORY_CARE_PROVIDER_SITE_OTHER): Payer: BC Managed Care – PPO | Admitting: Internal Medicine

## 2013-07-16 ENCOUNTER — Encounter: Payer: Self-pay | Admitting: Internal Medicine

## 2013-07-16 VITALS — BP 126/62 | HR 60 | Temp 97.7°F | Ht 67.0 in | Wt 125.4 lb

## 2013-07-16 DIAGNOSIS — Z23 Encounter for immunization: Secondary | ICD-10-CM

## 2013-07-16 DIAGNOSIS — M2170 Unequal limb length (acquired), unspecified site: Secondary | ICD-10-CM | POA: Insufficient documentation

## 2013-07-16 DIAGNOSIS — R634 Abnormal weight loss: Secondary | ICD-10-CM

## 2013-07-16 DIAGNOSIS — M069 Rheumatoid arthritis, unspecified: Secondary | ICD-10-CM

## 2013-07-16 DIAGNOSIS — K439 Ventral hernia without obstruction or gangrene: Secondary | ICD-10-CM

## 2013-07-16 MED ORDER — TETANUS-DIPHTH-ACELL PERTUSSIS 5-2.5-18.5 LF-MCG/0.5 IM SUSP: 0.5000 mL | Freq: Once | INTRAMUSCULAR | Status: DC

## 2013-07-16 NOTE — Progress Notes (Signed)
Patient ID: [name], male   DOB: 1932/03/14, 80 y.o.   MRN: [id number]   Location:  [facility] [place] [facility]  Code Status: info provided to complete advance directives  No Known Allergies  Chief Complaint  Patient presents with  . Establish Care    NP-Establish care  . other    LT ? elbow injection  . Immunizations    never had Pneumo and print Tdap today    HPI: Patient is a 80 y.o. seen in the office today to establish with the practice.  Previously followed by Dr. Ranelle Oyster saw him about 3 years ago re: hernia, per pt. They have come here b/c he needs a doctor per his wife.    Feels like he is doing great.  Has rheumatoid arthritis.  Years ago, he followed a rheumatologist.  Had not been getting good answers from them.  Takes 5 ibuprofen tablets in the morning and good all day.  Took methotrexate and had reaction to it.  Had gold injections.  Meds only worked for short periods of time.  Has been using the ibuprofen for 20 years.  Has had RA since 80 yo.  Has not had indigestion or stomach problems from the ibuprofen.  Did not tolerate aspirin on his stomach.  Might take one extra ibuprofen for stiffness in the middle of the night and go back to sleep in 30 mins.    As far as he knows, he has not had kidney problems either.    Does have hearing loss and does well with it with hearing aide.  Getting one repaired right now.    Vision is good.  Wears readers at computer.    Says fell in 2012 and hit head.  Didn't knock him out.  No significant falls since that time.  Had a fall 10 years ago where broke ribs.    Has been recommended he get an elbow replacement, but would not be able to lift then and is very active.  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    Doesn't have an appetite--has to force himself to eat.  Is active doing things all of the time.  [facility]   Used to weigh about 15 lbs more, he says. Lost 35  lbs when he had the appendix rupture and never gained it back.    Review of Systems:  Review of Systems  Constitutional: Positive for weight loss.  HENT: Positive for hearing loss.        Dentures; has hearing aide  Eyes: Negative for blurred vision.       Dry eyes  Cardiovascular: Negative for chest pain and leg swelling.  Gastrointestinal: Negative for heartburn, constipation, blood in stool and melena.  Genitourinary: Negative for dysuria, urgency and frequency.  Musculoskeletal: Positive for joint pain and myalgias.       Restricted ROM;  Rheumatoid arthritis with nodules  Skin: Negative for rash.  Neurological: Positive for headaches.  Endo/Heme/Allergies: Bruises/bleeds easily.  Psychiatric/Behavioral: Negative for depression and memory loss. The patient is not nervous/anxious and does not have insomnia.    Past Medical History  Diagnosis Date  . Arthritis   . Ruptured appendix     Past Surgical History  Procedure Laterality Date  . Shoulder surgery Right 1982  . Shoulder surgery Left 1992   . Appendectomy  1978    Social History:   reports that he has never smoked.  He does not have any smokeless tobacco history on file. He reports that he drinks alcohol. He reports that he does not use illicit drugs.  Family History  Problem Relation Age of Onset  . Mental illness Sister   . Diabetes Sister     Medications: Patient's Medications  New Prescriptions   No medications on file  Previous Medications   IBUPROFEN (ADVIL,MOTRIN) 200 MG TABLET    Take 200 mg by mouth as needed.   TDAP (BOOSTRIX) 5-2.5-18.5 LF-MCG/0.5 INJECTION    Inject 0.5 mLs into the muscle once.  Modified Medications   No medications on file  Discontinued Medications   No medications on file     Physical Exam: Filed Vitals:   07/16/13 0851  BP: 126/62  Pulse: 60  Temp: 97.7 F (36.5 C)  TempSrc: Oral  Height: 5\' 7"  (1.702 m)  Weight: 125 lb 6.4 oz (56.881 kg)  SpO2: 99%  Physical  Exam  Constitutional: He is oriented to person, place, and time. He appears well-nourished. No distress.  Thin white male appears younger than stated age  HENT:  Head: Normocephalic and atraumatic.  Right Ear: External ear normal.  Left Ear: External ear normal.  Nose: Nose normal.  Mouth/Throat: Oropharynx is clear and moist.  Cardiovascular: Normal rate, regular rhythm, normal heart sounds and intact distal pulses.   Pulmonary/Chest: Breath sounds normal. No respiratory distress.  Abdominal: Soft. Bowel sounds are normal. He exhibits no distension. There is no tenderness. A hernia is present.  Musculoskeletal:  Decreased ROM shoulders--right able to abduct almost fully, left limited to about 90 degrees;  Loss of ROM at wrists (scars from surgery); also has ulnar deviation of fingers and has already had scars from surgery on all 10 fingers;  Has rheumatoid nodules on both elbows and on fingers   Neurological: He is alert and oriented to person, place, and time.  Skin: Skin is warm and dry.  Psychiatric: He has a normal mood and affect.    Labs reviewed: None recent  Assessment/Plan 1. RHEUMATOID ARTHRITIS - schedule an appt for steroid injection of left elbow and evaluation of long left leg s/p left hip replacement with Dr. -currently using a lot of ibuprofen to treat his pain -has not tolerated GOLD therapy or methotrexate or aspirin therapy in the past for his RA and does not want to return to rheumatology at this point -will check renal function due to ibuprofen use - CBC With differential/Platelet - Comprehensive metabolic panel  2. ABDOMINAL WALL HERNIA - has been stable, cont to monitor - Comprehensive metabolic panel  3. Loss of weight -since peritonitis from appendicitis -will r/o reversible causes including hyperthyroidism and diabetes, but suspect this is primarily due to his very active state -will discuss higher protein diet next time - Comprehensive metabolic  panel - Hemoglobin A1c - TSH  4. Acquired leg length discrepancy - s/p left hip replacement -f/u with Dr. Charlann Boxer as he plans to do  -has put lift in his shoe -having increased hip pain he relates to this - Comprehensive metabolic panel  5. Need for prophylactic vaccination with combined diphtheria-tetanus-pertussis (DTP) vaccine -script given to get tdap at pharmacy - Tdap (BOOSTRIX) 5-2.5-18.5 LF-MCG/0.5 injection; Inject 0.5 mLs into the muscle once.  Dispense: 0.5 mL; Refill: 0  Labs/tests ordered:   Orders Placed This Encounter  Procedures  . CBC With differential/Platelet  . Comprehensive metabolic panel  . Hemoglobin A1c  . TSH   Next appt:  3 mos  for annual exam and will order flp and uric acid at that time

## 2013-07-17 LAB — HEMOGLOBIN A1C
Est. average glucose Bld gHb Est-mCnc: 117 mg/dL
Hgb A1c MFr Bld: 5.7 % — ABNORMAL HIGH (ref 4.8–5.6)

## 2013-07-17 LAB — COMPREHENSIVE METABOLIC PANEL
ALT: 9 IU/L (ref 0–44)
AST: 15 IU/L (ref 0–40)
Albumin/Globulin Ratio: 1.1 (ref 1.1–2.5)
Albumin: 3.7 g/dL (ref 3.5–4.8)
Alkaline Phosphatase: 79 IU/L (ref 39–117)
BUN/Creatinine Ratio: 27 — ABNORMAL HIGH (ref 10–22)
BUN: 20 mg/dL (ref 8–27)
CO2: 24 mmol/L (ref 18–29)
Calcium: 9.2 mg/dL (ref 8.6–10.2)
Chloride: 100 mmol/L (ref 97–108)
Creatinine, Ser: 0.74 mg/dL — ABNORMAL LOW (ref 0.76–1.27)
GFR calc Af Amer: 101 mL/min/{1.73_m2} (ref >59)
GFR calc non Af Amer: 88 mL/min/{1.73_m2} (ref >59)
Globulin, Total: 3.3 g/dL (ref 1.5–4.5)
Glucose: 98 mg/dL (ref 65–99)
Potassium: 4.4 mmol/L (ref 3.5–5.2)
Sodium: 138 mmol/L (ref 134–144)
Total Bilirubin: <0.2 mg/dL (ref 0.0–1.2)
Total Protein: 7 g/dL (ref 6.0–8.5)

## 2013-07-17 LAB — TSH: TSH: 4.42 u[IU]/mL (ref 0.450–4.500)

## 2013-07-17 LAB — CBC WITH DIFFERENTIAL
Basophils Absolute: 0 10*3/uL (ref 0.0–0.2)
Basos: 0 %
Eos: 3 %
Eosinophils Absolute: 0.2 10*3/uL (ref 0.0–0.4)
HCT: 33.2 % — ABNORMAL LOW (ref 37.5–51.0)
Hemoglobin: 11 g/dL — ABNORMAL LOW (ref 12.6–17.7)
Immature Grans (Abs): 0 10*3/uL (ref 0.0–0.1)
Immature Granulocytes: 0 %
Lymphocytes Absolute: 1.3 10*3/uL (ref 0.7–3.1)
Lymphs: 20 %
MCH: 26.8 pg (ref 26.6–33.0)
MCHC: 33.1 g/dL (ref 31.5–35.7)
MCV: 81 fL (ref 79–97)
Monocytes Absolute: 0.6 10*3/uL (ref 0.1–0.9)
Monocytes: 9 %
Neutrophils Absolute: 4.5 10*3/uL (ref 1.4–7.0)
Neutrophils Relative %: 68 %
Platelets: 280 10*3/uL (ref 150–379)
RBC: 4.1 x10E6/uL — ABNORMAL LOW (ref 4.14–5.80)
RDW: 15.7 % — ABNORMAL HIGH (ref 12.3–15.4)
WBC: 6.6 10*3/uL (ref 3.4–10.8)

## 2013-10-15 ENCOUNTER — Ambulatory Visit (INDEPENDENT_AMBULATORY_CARE_PROVIDER_SITE_OTHER): Payer: BC Managed Care – PPO | Admitting: Internal Medicine

## 2013-10-15 ENCOUNTER — Encounter: Payer: Self-pay | Admitting: Internal Medicine

## 2013-10-15 VITALS — BP 142/80 | HR 64 | Temp 97.6°F | Resp 10 | Ht 66.5 in | Wt 124.6 lb

## 2013-10-15 DIAGNOSIS — K439 Ventral hernia without obstruction or gangrene: Secondary | ICD-10-CM

## 2013-10-15 DIAGNOSIS — Z7189 Other specified counseling: Secondary | ICD-10-CM

## 2013-10-15 DIAGNOSIS — R739 Hyperglycemia, unspecified: Secondary | ICD-10-CM

## 2013-10-15 DIAGNOSIS — Z0000 Encounter for general adult medical examination without abnormal findings: Secondary | ICD-10-CM

## 2013-10-15 DIAGNOSIS — R634 Abnormal weight loss: Secondary | ICD-10-CM

## 2013-10-15 DIAGNOSIS — M069 Rheumatoid arthritis, unspecified: Secondary | ICD-10-CM

## 2013-10-15 DIAGNOSIS — M2170 Unequal limb length (acquired), unspecified site: Secondary | ICD-10-CM

## 2013-10-15 NOTE — Patient Instructions (Signed)
Please arrange an appointment with Dr. Amanda Pea or Dr. Charlann Boxer to do your steroid injection in your left elbow.     Please complete your living will/health care power of attorney paperwork.  I gave you a blank copy today.  This will need to be notarized.  Keep a copy for you, your health care power of attorney and bring one to me for your chart.  We will get your records from [facility] about your last cscope.

## 2013-10-15 NOTE — Progress Notes (Signed)
Passed clock drawing 

## 2013-10-15 NOTE — Progress Notes (Signed)
Patient ID: [name], male   DOB: 03-22-1932, 80 y.o.   MRN: [id number]   Location:  [facility] / [place] [facility]  Code Status: info was given last time to complete advance directive  No Known Allergies  Chief Complaint  Patient presents with  . Annual Exam    Yearly check-up, no recent labs     HPI: Patient is a 80 y.o. white male seen in the office today for his annual exam.  Never went to get shots for his left elbow as planned.    Got flu shot at pharmacy.  Refuses tetanus booster/whooping cough booster despite recommendation and encouragement.  Did have colonoscopy several years ago at [facility].    Has been watching his sweets now after hba1c.    Missed 2 pts on MMSE with score 28/30 on recall.  Passed clock drawing.  Not wearing hearing aide today.  Forgets it sometimes with left ear.    Review of Systems:  ROS  Past Medical History  Diagnosis Date  . Rheumatoid arthritis involving multiple joints   . Ruptured appendix   . Ventral hernia     Past Surgical History  Procedure Laterality Date  . Shoulder surgery Right 1982  . Shoulder surgery Left 1992     Dr. Valentina Gu  . Appendectomy  1978    had peritonitis (ruptured), had exlap  . Hand surgery  2005, 2010    multiple hand joint replacements Dr. Amanda Pea  . Left hip replacement      Dr. Charlann Boxer    Social History:   reports that he has never smoked. He does not have any smokeless tobacco history on file. He reports that he drinks alcohol. He reports that he does not use illicit drugs.  Family History  Problem Relation Age of Onset  . Mental illness Sister   . Diabetes Sister     Medications: Patient's Medications  New Prescriptions   No medications on file  Previous Medications   IBUPROFEN (ADVIL,MOTRIN) 200 MG TABLET    Take 200 mg by mouth as needed.  Modified Medications   No medications on file  Discontinued Medications   TDAP (BOOSTRIX) 5-2.5-18.5 LF-MCG/0.5 INJECTION     Inject 0.5 mLs into the muscle once.     Physical Exam: Filed Vitals:   10/15/13 1348  BP: 142/80  Pulse: 64  Temp: 97.6 F (36.4 C)  TempSrc: Oral  Resp: 10  Height: 5' 6.5" (1.689 m)  Weight: 124 lb 9.6 oz (56.518 kg)  Physical Exam  Labs reviewed: Basic Metabolic Panel:  Recent Labs  83/66/29 1017  NA 138  K 4.4  CL 100  CO2 24  GLUCOSE 98  BUN 20  CREATININE 0.74*  CALCIUM 9.2  TSH 4.420   Liver Function Tests:  Recent Labs  07/16/13 1017  AST 15  ALT 9  ALKPHOS 79  BILITOT <0.2  PROT 7.0   No results found for this basename: LIPASE, AMYLASE,  in the last 8760 hours No results found for this basename: AMMONIA,  in the last 8760 hours CBC:  Recent Labs  07/16/13 1017  WBC 6.6  NEUTROABS 4.5  HGB 11.0*  HCT 33.2*  MCV 81  PLT 280   Lipid Panel: No results found for this basename: CHOL, HDL, LDLCALC, TRIG, CHOLHDL, LDLDIRECT,  in the last 8760 hours Lab Results  Component Value Date   HGBA1C 5.7* 07/16/2013    Past Procedures: Had cscope "several years ago" at  [facility]  Assessment/Plan 1. Routine general medical examination at a health care facility -refuses to get tdap booster -had flu shot at pharmacy for year -had pneumococcal 23, will need prevnar next time -obtain copy of cscope from [facility]--if due, will try to do cologuard with him -fall risk and depression screening were done 7/15 and negative -MMSE today 28/30 missing on recall only; clock drawing passed  2. Rheumatoid arthritis -continues his ibuprofen amazingly without GI complications or renal complications--continue to monitor -wants injection in left elbow and with his deformities, I do not feel comfortable with doing this myself and advised he see Dr. Charlann Boxer or Dr. Amanda Pea for this  3. Acquired leg length discrepancy -plans to discuss with Dr. Charlann Boxer;  Now having right hip pain also  4. Loss of weight -stable--happened after appendicitis and hernia  5. Ventral  hernia without obstruction or gangrene -resolved  6. Hyperglycemia - f/u levels, has changed diet - Hemoglobin A1c  7. Advanced care planning/counseling discussion -  Labs/tests ordered: Next appt:  Thersea Manfredonia L. Evangeline Utley, D.O. Geriatrics [facility] [address] [place], [place] [address] Cell Phone (Mon-Fri 8am-5pm):  [phone] On Call:  [phone] & follow prompts after 5pm & weekends Office Phone:  [phone] Office Fax:  [phone]

## 2013-10-16 LAB — HEMOGLOBIN A1C
Est. average glucose Bld gHb Est-mCnc: 114 mg/dL
Hgb A1c MFr Bld: 5.6 % (ref 4.8–5.6)

## 2014-04-15 ENCOUNTER — Encounter: Payer: Self-pay | Admitting: Internal Medicine

## 2014-04-15 ENCOUNTER — Ambulatory Visit (INDEPENDENT_AMBULATORY_CARE_PROVIDER_SITE_OTHER): Payer: Federal, State, Local not specified - PPO | Admitting: Internal Medicine

## 2014-04-15 VITALS — BP 130/80 | HR 68 | Temp 97.4°F | Resp 18 | Ht 66.5 in | Wt 115.4 lb

## 2014-04-15 DIAGNOSIS — M069 Rheumatoid arthritis, unspecified: Secondary | ICD-10-CM

## 2014-04-15 DIAGNOSIS — K439 Ventral hernia without obstruction or gangrene: Secondary | ICD-10-CM

## 2014-04-15 DIAGNOSIS — R739 Hyperglycemia, unspecified: Secondary | ICD-10-CM

## 2014-04-15 DIAGNOSIS — Z23 Encounter for immunization: Secondary | ICD-10-CM

## 2014-04-15 DIAGNOSIS — R634 Abnormal weight loss: Secondary | ICD-10-CM

## 2014-04-15 NOTE — Progress Notes (Signed)
Patient ID: [name], male   DOB: [birth year], 81 y.o.   MRN: [id number]   Location:  [facility]  No Known Allergies  Chief Complaint  Patient presents with  . Medical Management of Chronic Issues    HPI: Patient is a 81 y.o.  seen in the office today for medical mgt of chronic issues.   Eliminated fear of falling in tub.  Got a walk-in tub.   Closed car door on his right hand.   Elbow pain has gotten better after working out at [employer].  Never had to go to orthopedics for the pain for injection after all. Continues with his chronic ventral hernia--remains reducible and nonpainful. Lost another 9 lbs with working out.   Eating ok--probably not enough--wants to gain weight--thinks his appetite may have gotten better with exercise--says it goes up and down. Is busy and does not think of eating.  His wife has been getting some ensure to drink.  Drinks one per meal.  Eats breakfast and supper.  Has to force himself to eat b/w.    Has not fallen.  Is not depressed. Has family going to graduate from [employer].  He's happy about that.  His wife is doing well.    Review of Systems:  Review of Systems  Constitutional: Positive for weight loss. Negative for fever, chills and malaise/fatigue.  HENT: Negative for congestion and hearing loss.   Eyes: Negative for blurred vision.  Respiratory: Negative for shortness of breath.   Cardiovascular: Negative for chest pain and leg swelling.  Gastrointestinal: Negative for abdominal pain, constipation, blood in stool and melena.  Genitourinary: Negative for dysuria, urgency and frequency.       Gets up 2x at night to urinate, but drinks coffee all day and evening  Musculoskeletal: Positive for back pain and joint pain. Negative for myalgias and falls.  Neurological: Negative for dizziness, loss of consciousness, weakness and headaches.  Endo/Heme/Allergies: Does not bruise/bleed easily.    Psychiatric/Behavioral: Negative for depression and memory loss. The patient is not nervous/anxious.     Past Medical History  Diagnosis Date  . Rheumatoid arthritis involving multiple joints   . Ruptured appendix   . Ventral hernia     Past Surgical History  Procedure Laterality Date  . Shoulder surgery Right 1982  . Shoulder surgery Left 1992     Dr. Valentina Gu  . Appendectomy  1978    had peritonitis (ruptured), had exlap  . Hand surgery  2005, 2010    multiple hand joint replacements Dr. Amanda Pea  . Left hip replacement      Dr. Charlann Boxer    Social History:   reports that he has never smoked. He does not have any smokeless tobacco history on file. He reports that he drinks alcohol. He reports that he does not use illicit drugs.  Family History  Problem Relation Age of Onset  . Mental illness Sister   . Diabetes Sister     Medications: Patient's Medications  New Prescriptions   No medications on file  Previous Medications   IBUPROFEN (ADVIL,MOTRIN) 200 MG TABLET    Take 200 mg by mouth as needed.  Modified Medications   No medications on file  Discontinued Medications   No medications on file     Physical Exam: Filed Vitals:   04/15/14 0814  BP: 130/80  Pulse: 68  Temp: 97.4 F (36.3 C)  TempSrc: Oral  Resp: 18  Height:  5' 6.5" (1.689 m)  Weight: 115 lb 6.4 oz (52.345 kg)  SpO2: 94%  Physical Exam  Constitutional: He is oriented to person, place, and time. He appears well-developed and well-nourished. No distress.  Thin white male  Cardiovascular: Normal rate, regular rhythm, normal heart sounds and intact distal pulses.   Pulmonary/Chest: Effort normal and breath sounds normal. No respiratory distress.  Abdominal: Soft. Bowel sounds are normal. He exhibits no distension and no mass. There is no tenderness.  Musculoskeletal:  Ulnar deviation of hands, deformities and rheumatoid nodules of elbows, feet, hands  Neurological: He is alert and oriented to person,  place, and time.  Psychiatric: He has a normal mood and affect. His behavior is normal. Judgment and thought content normal.    Labs reviewed: Basic Metabolic Panel:  Recent Labs  91/69/45 1017  NA 138  K 4.4  CL 100  CO2 24  GLUCOSE 98  BUN 20  CREATININE 0.74*  CALCIUM 9.2  TSH 4.420   Liver Function Tests:  Recent Labs  07/16/13 1017  AST 15  ALT 9  ALKPHOS 79  BILITOT <0.2  PROT 7.0   No results for input(s): LIPASE, AMYLASE in the last 8760 hours. No results for input(s): AMMONIA in the last 8760 hours. CBC:  Recent Labs  07/16/13 1017  WBC 6.6  NEUTROABS 4.5  HGB 11.0*  HCT 33.2*  MCV 81  PLT 280   Lipid Panel: No results for input(s): CHOL, HDL, LDLCALC, TRIG, CHOLHDL, LDLDIRECT in the last 8760 hours. Lab Results  Component Value Date   HGBA1C 5.6 10/15/2013    Assessment/Plan 1. Rheumatoid arthritis - has refused immune modulating therapy and continues to use ibuprofen -tylenol has been ineffective -is helped by whirlpool bath walk-in shower - CBC with Differential/Platelet  2. Loss of weight -has lost 9lbs since last visit--does not eat lunch -encouraged at least 3 meals per day -is using ensure supplement tid also, likes vanilla flavor  3. Ventral hernia without obstruction or gangrene -remains nonpainful and reducible -knows when to seek emergent intervention  4. Hyperglycemia -f/u labs today b/c he is fasting - Comprehensive metabolic panel - Hemoglobin A1c - Lipid panel  5. Need for vaccination with 13-polyvalent pneumococcal conjugate vaccine -prevnar given today -unknown if he had pneumovax, but I doubt it b/c he does not tend to see the doctor much  Labs/tests ordered:   Orders Placed This Encounter  Procedures  . CBC with Differential/Platelet  . Comprehensive metabolic panel    Order Specific Question:  Has the patient fasted?    Answer:  Yes  . Hemoglobin A1c  . Lipid panel    Order Specific Question:  Has the  patient fasted?    Answer:  Yes  prevnar  Next appt:  6 mos with labs day of appt  Jacynda Brunke L. Inman Fettig, D.O. Geriatrics [facility] [address], [place] [address] Cell Phone (Mon-Fri 8am-5pm):  [phone] On Call:  [phone] & follow prompts after 5pm & weekends Office Phone:  [phone] Office Fax:  [phone]

## 2014-04-16 LAB — CBC WITH DIFFERENTIAL/PLATELET
Basophils Absolute: 0 10*3/uL (ref 0.0–0.2)
Basos: 0 %
Eos: 3 %
Eosinophils Absolute: 0.2 10*3/uL (ref 0.0–0.4)
HCT: 32.1 % — ABNORMAL LOW (ref 37.5–51.0)
Hemoglobin: 10.8 g/dL — ABNORMAL LOW (ref 12.6–17.7)
Immature Grans (Abs): 0 10*3/uL (ref 0.0–0.1)
Immature Granulocytes: 0 %
Lymphocytes Absolute: 1.1 10*3/uL (ref 0.7–3.1)
Lymphs: 17 %
MCH: 26.9 pg (ref 26.6–33.0)
MCHC: 33.6 g/dL (ref 31.5–35.7)
MCV: 80 fL (ref 79–97)
Monocytes Absolute: 0.4 10*3/uL (ref 0.1–0.9)
Monocytes: 7 %
Neutrophils Absolute: 4.9 10*3/uL (ref 1.4–7.0)
Neutrophils Relative %: 73 %
Platelets: 311 10*3/uL (ref 150–379)
RBC: 4.01 x10E6/uL — ABNORMAL LOW (ref 4.14–5.80)
RDW: 15.6 % — ABNORMAL HIGH (ref 12.3–15.4)
WBC: 6.7 10*3/uL (ref 3.4–10.8)

## 2014-04-16 LAB — COMPREHENSIVE METABOLIC PANEL
ALT: 6 IU/L (ref 0–44)
AST: 14 IU/L (ref 0–40)
Albumin/Globulin Ratio: 0.8 — ABNORMAL LOW (ref 1.1–2.5)
Albumin: 3.4 g/dL — ABNORMAL LOW (ref 3.5–4.7)
Alkaline Phosphatase: 68 IU/L (ref 39–117)
BUN/Creatinine Ratio: 28 — ABNORMAL HIGH (ref 10–22)
BUN: 19 mg/dL (ref 8–27)
Bilirubin Total: 0.2 mg/dL (ref 0.0–1.2)
CO2: 23 mmol/L (ref 18–29)
Calcium: 8.8 mg/dL (ref 8.6–10.2)
Chloride: 97 mmol/L (ref 97–108)
Creatinine, Ser: 0.68 mg/dL — ABNORMAL LOW (ref 0.76–1.27)
GFR calc Af Amer: 104 mL/min/{1.73_m2} (ref >59)
GFR calc non Af Amer: 90 mL/min/{1.73_m2} (ref >59)
Globulin, Total: 4.2 g/dL (ref 1.5–4.5)
Glucose: 93 mg/dL (ref 65–99)
Potassium: 4.6 mmol/L (ref 3.5–5.2)
Sodium: 135 mmol/L (ref 134–144)
Total Protein: 7.6 g/dL (ref 6.0–8.5)

## 2014-04-16 LAB — HEMOGLOBIN A1C
Est. average glucose Bld gHb Est-mCnc: 108 mg/dL
Hgb A1c MFr Bld: 5.4 % (ref 4.8–5.6)

## 2014-04-16 LAB — LIPID PANEL
Chol/HDL Ratio: 2.7 ratio units (ref 0.0–5.0)
Cholesterol, Total: 100 mg/dL (ref 100–199)
HDL: 37 mg/dL — ABNORMAL LOW (ref >39)
LDL Calculated: 54 mg/dL (ref 0–99)
Triglycerides: 43 mg/dL (ref 0–149)
VLDL Cholesterol Cal: 9 mg/dL (ref 5–40)

## 2014-10-15 ENCOUNTER — Ambulatory Visit: Payer: Federal, State, Local not specified - PPO | Admitting: Internal Medicine

## 2014-10-18 ENCOUNTER — Ambulatory Visit (INDEPENDENT_AMBULATORY_CARE_PROVIDER_SITE_OTHER): Payer: Federal, State, Local not specified - PPO | Admitting: Internal Medicine

## 2014-10-18 ENCOUNTER — Other Ambulatory Visit: Payer: Self-pay

## 2014-10-18 ENCOUNTER — Encounter: Payer: Self-pay | Admitting: Internal Medicine

## 2014-10-18 VITALS — BP 128/68 | HR 77 | Temp 97.8°F | Resp 20 | Ht 67.0 in | Wt 124.0 lb

## 2014-10-18 DIAGNOSIS — M069 Rheumatoid arthritis, unspecified: Secondary | ICD-10-CM | POA: Diagnosis not present

## 2014-10-18 DIAGNOSIS — Z1211 Encounter for screening for malignant neoplasm of colon: Secondary | ICD-10-CM

## 2014-10-18 DIAGNOSIS — M2170 Unequal limb length (acquired), unspecified site: Secondary | ICD-10-CM

## 2014-10-18 DIAGNOSIS — K4090 Unilateral inguinal hernia, without obstruction or gangrene, not specified as recurrent: Secondary | ICD-10-CM | POA: Diagnosis not present

## 2014-10-18 DIAGNOSIS — R636 Underweight: Secondary | ICD-10-CM | POA: Diagnosis not present

## 2014-10-18 DIAGNOSIS — Z23 Encounter for immunization: Secondary | ICD-10-CM | POA: Diagnosis not present

## 2014-10-18 DIAGNOSIS — R739 Hyperglycemia, unspecified: Secondary | ICD-10-CM | POA: Diagnosis not present

## 2014-10-18 MED ORDER — LIDOCAINE HCL (PF) 1 % IJ SOLN
1.0000 mL | Freq: Once | INTRAMUSCULAR | Status: AC
  Administered 2014-10-18: 1 mL

## 2014-10-18 MED ORDER — LIDOCAINE HCL (PF) 1 % IJ SOLN: 1.0000 mL | Freq: Once | INTRAMUSCULAR | Status: DC

## 2014-10-18 MED ORDER — TRIAMCINOLONE ACETONIDE 40 MG/ML IJ SUSP
40.0000 mg | Freq: Once | INTRAMUSCULAR | Status: AC
  Administered 2014-10-18: 40 mg via INTRAMUSCULAR

## 2014-10-18 NOTE — Addendum Note (Signed)
Addended by: Particia Lather C on: 10/18/2014 04:11 PM   Modules accepted: Orders

## 2014-10-18 NOTE — Progress Notes (Signed)
Patient ID: [name], male   DOB: December 02, 1932, 81 y.o.   MRN: [id number]   Location: [facility] Provider: Gwenith Spitz. Renato Gails, D.O., C.M.D.  Code Status:full code Goals of Care: Advanced Directive information Does patient have an advance directive?: No, Would patient like information on creating an advanced directive?: No - patient declined information  Chief Complaint  Patient presents with  . Medical Management of Chronic Issues    6 month follow-up for Hyperglycemia  . Immunizations    Would like flu shot today  . OTHER    Patient says he is due shots in both knees    HPI: Patient is a 81 y.o. male seen in the office today for med mgt chronic diseases.    Knee pain has been worse recently.  Right is really bothering him.  Not as sore right now due to ibuprofen.  Right has been waking him at night.  Having trouble getting up out of chair--has to wobble several times and use arms.  Is determined to do it himself.  Extensively discussed once again the options now available for RA treatment.  Tried methotrexate before and did not tolerate it well.  He is afraid of humira, etc. Due to the side effects listed in the commercials.  Counseled on where that list comes from and benefits, but he refuses to even see a rheumatologist.    Weight is a little better.  Has been focusing on what he's eating to gain some weight again.  Is up 9 lbs.  Has been having dinner at [employer] nightly.  His wife has to eat special food so she won't go out there.  He's not a cook and she makes her own food.    Review of Systems:  Review of Systems  Constitutional: Negative for fever, chills and weight loss.       Gained weight which is good in his case  Eyes: Negative for blurred vision.  Respiratory: Negative for shortness of breath.   Cardiovascular: Negative for chest pain.  Gastrointestinal: Negative for heartburn, nausea, vomiting and abdominal pain.  Genitourinary: Negative for dysuria.   Musculoskeletal: Positive for myalgias, joint pain and falls.  Skin: Negative for rash.  Neurological: Negative for dizziness, loss of consciousness and headaches.  Psychiatric/Behavioral: Negative for memory loss.    Past Medical History  Diagnosis Date  . Rheumatoid arthritis involving multiple joints (HCC)   . Ruptured appendix   . Ventral hernia     Past Surgical History  Procedure Laterality Date  . Shoulder surgery Right 1982  . Shoulder surgery Left 1992     Dr. Valentina Gu  . Appendectomy  1978    had peritonitis (ruptured), had exlap  . Hand surgery  2005, 2010    multiple hand joint replacements Dr. Amanda Pea  . Left hip replacement      Dr. Charlann Boxer    No Known Allergies    Medication List       This list is accurate as of: 10/18/14 10:32 AM.  Always use your most recent med list.               ibuprofen 200 MG tablet  Commonly known as:  ADVIL,MOTRIN  Take 200 mg by mouth as needed.        Health Maintenance  Topic Date Due  . TETANUS/TDAP  01/20/1953  . ZOSTAVAX  01/20/1994  . INFLUENZA VACCINE  08/02/2014  . PNA vac Low Risk Adult (2 of 2 - PPSV23)  04/15/2015    Physical Exam: Filed Vitals:   10/18/14 0957  BP: 128/68  Pulse: 77  Temp: 97.8 F (36.6 C)  TempSrc: Oral  Resp: 20  Height: 5\' 7"  (1.702 m)  Weight: 124 lb (56.246 kg)  SpO2: 97%   Body mass index is 19.42 kg/(m^2). Physical Exam  Constitutional: He is oriented to person, place, and time.  Thin white male  Cardiovascular: Normal rate, regular rhythm, normal heart sounds and intact distal pulses.   Pulmonary/Chest: Effort normal and breath sounds normal. No respiratory distress.  Abdominal: Soft. Bowel sounds are normal. There is tenderness. A hernia is present.  Left groin, reducible  Musculoskeletal: He exhibits edema and tenderness.  Severe of bilateral knees with warmth and small effusions present right greater than left; deformity of hands is severe, also has rheumatoid  nodules of elbows; increased difficulty rising from chair due to knee pain and stiffness today  Neurological: He is alert and oriented to person, place, and time.  Skin:  Small nodules on scalp, at joints and between fingers  Psychiatric: He has a normal mood and affect.    Labs reviewed: Basic Metabolic Panel:  Recent Labs  0904  NA 135  K 4.6  CL 97  CO2 23  GLUCOSE 93  BUN 19  CREATININE 0.68*  CALCIUM 8.8   Liver Function Tests:  Recent Labs  04/15/14 0904  AST 14  ALT 6  ALKPHOS 68  BILITOT 0.2  PROT 7.6  ALBUMIN 3.4*   No results for input(s): LIPASE, AMYLASE in the last 8760 hours. No results for input(s): AMMONIA in the last 8760 hours. CBC:  Recent Labs  04/15/14 0904  WBC 6.7  NEUTROABS 4.9  HGB 10.8*  HCT 32.1*  MCV 80  PLT 311   Lipid Panel:  Recent Labs  04/15/14 0904  CHOL 100  HDL 37*  LDLCALC 54  TRIG 43  CHOLHDL 2.7   Lab Results  Component Value Date   HGBA1C 5.4 04/15/2014   Assessment/Plan 1. Rheumatoid arthritis involving multiple sites, unspecified rheumatoid factor presence (HCC) -knees are especially painful right greater than left and he is requesting injections today for comfort as he cannot stand well now -bilateral knees were cleansed with betadine and areas of injectabs toion identified medially, sprayed with topical cooling anesthetic, both knees were injected in the intraarticular space with 40mg  of depomedrol/1cc and 1cc of 1% lidocaine, then betadine cleaned off and bandaids applied -procedures were tolerated well  2. Hyperglycemia - f/u labs today especially with evening 04/17/2014 [facility] visits - Comprehensive metabolic panel - Hemoglobin A1c - Lipid panel  3. Low weight for height -has gained weight by eating out at supper--monitor glucose  4. Acquired leg length discrepancy -noted, worsens his walking and joint problems  5. Left groin hernia -has been stable, remains reducible  6. Need for  prophylactic vaccination and inoculation against influenza - Flu Vaccine QUAD 36+ mos PF IM (Fluarix & Fluzone Quad PF)  7. Screening for colon cancer - Fecal occult blood, imunochemical; Future--given today--pt to complete and return to the lab  Labs/tests ordered:   Orders Placed This Encounter  Procedures  . Fecal occult blood, imunochemical    Standing Status: Future     Number of Occurrences:      Standing Expiration Date: 10/18/2015  . Flu Vaccine QUAD 36+ mos PF IM (Fluarix & Fluzone Quad PF)  . Comprehensive metabolic panel    Order Specific Question:  Has the patient fasted?  Answer:  No  . Hemoglobin A1c  . Lipid panel    Order Specific Question:  Has the patient fasted?    Answer:  No    Next appt:  04/11/2015 annual exam with pneumovax, labs day of   Quyen Cutsforth L. Carnie Bruemmer, D.O. Geriatrics [facility] [address], [place] [address] Cell Phone (Mon-Fri 8am-5pm):  [phone] On Call:  [phone] & follow prompts after 5pm & weekends Office Phone:  [phone] Office Fax:  [phone]

## 2014-10-19 LAB — LIPID PANEL
Chol/HDL Ratio: 3 ratio units (ref 0.0–5.0)
Cholesterol, Total: 106 mg/dL (ref 100–199)
HDL: 35 mg/dL — ABNORMAL LOW (ref >39)
LDL Calculated: 60 mg/dL (ref 0–99)
Triglycerides: 53 mg/dL (ref 0–149)
VLDL Cholesterol Cal: 11 mg/dL (ref 5–40)

## 2014-10-19 LAB — COMPREHENSIVE METABOLIC PANEL
ALT: 9 IU/L (ref 0–44)
AST: 15 IU/L (ref 0–40)
Albumin/Globulin Ratio: 0.8 — ABNORMAL LOW (ref 1.1–2.5)
Albumin: 3.6 g/dL (ref 3.5–4.7)
Alkaline Phosphatase: 80 IU/L (ref 39–117)
BUN/Creatinine Ratio: 24 — ABNORMAL HIGH (ref 10–22)
BUN: 15 mg/dL (ref 8–27)
Bilirubin Total: 0.2 mg/dL (ref 0.0–1.2)
CO2: 24 mmol/L (ref 18–29)
Calcium: 9 mg/dL (ref 8.6–10.2)
Chloride: 97 mmol/L (ref 97–106)
Creatinine, Ser: 0.62 mg/dL — ABNORMAL LOW (ref 0.76–1.27)
GFR calc Af Amer: 108 mL/min/{1.73_m2} (ref >59)
GFR calc non Af Amer: 94 mL/min/{1.73_m2} (ref >59)
Globulin, Total: 4.5 g/dL (ref 1.5–4.5)
Glucose: 93 mg/dL (ref 65–99)
Potassium: 4.2 mmol/L (ref 3.5–5.2)
Sodium: 135 mmol/L — ABNORMAL LOW (ref 136–144)
Total Protein: 8.1 g/dL (ref 6.0–8.5)

## 2014-10-19 LAB — HEMOGLOBIN A1C
Est. average glucose Bld gHb Est-mCnc: 103 mg/dL
Hgb A1c MFr Bld: 5.2 % (ref 4.8–5.6)

## 2015-04-11 ENCOUNTER — Encounter: Payer: Self-pay | Admitting: Internal Medicine

## 2015-04-11 ENCOUNTER — Ambulatory Visit (INDEPENDENT_AMBULATORY_CARE_PROVIDER_SITE_OTHER): Payer: Federal, State, Local not specified - PPO | Admitting: Internal Medicine

## 2015-04-11 VITALS — BP 120/60 | HR 68 | Temp 97.8°F | Ht 67.0 in | Wt 116.0 lb

## 2015-04-11 DIAGNOSIS — M25562 Pain in left knee: Secondary | ICD-10-CM | POA: Diagnosis not present

## 2015-04-11 DIAGNOSIS — Z23 Encounter for immunization: Secondary | ICD-10-CM | POA: Diagnosis not present

## 2015-04-11 DIAGNOSIS — H6092 Unspecified otitis externa, left ear: Secondary | ICD-10-CM | POA: Diagnosis not present

## 2015-04-11 DIAGNOSIS — M069 Rheumatoid arthritis, unspecified: Secondary | ICD-10-CM | POA: Diagnosis not present

## 2015-04-11 DIAGNOSIS — M2170 Unequal limb length (acquired), unspecified site: Secondary | ICD-10-CM

## 2015-04-11 DIAGNOSIS — R636 Underweight: Secondary | ICD-10-CM

## 2015-04-11 DIAGNOSIS — J309 Allergic rhinitis, unspecified: Secondary | ICD-10-CM | POA: Diagnosis not present

## 2015-04-11 DIAGNOSIS — R739 Hyperglycemia, unspecified: Secondary | ICD-10-CM

## 2015-04-11 DIAGNOSIS — Z0000 Encounter for general adult medical examination without abnormal findings: Secondary | ICD-10-CM

## 2015-04-11 MED ORDER — CETIRIZINE HCL 10 MG PO TABS: 10.0000 mg | ORAL_TABLET | Freq: Every day | ORAL | Status: DC

## 2015-04-11 MED ORDER — NEOMYCIN-POLYMYXIN-HC 1 % OT SOLN: 3.0000 [drp] | Freq: Four times a day (QID) | OTIC | Status: DC

## 2015-04-11 NOTE — Progress Notes (Signed)
Patient ID: [name], male   DOB: Apr 22, 1932, 82 y.o.   MRN: [id number]   Location:  [facility] clinic Provider: Shadasia Oldfield L. Renato Gails, D.O., C.M.D.  Patient Care Team: Kermit Balo, DO as PCP - General (Geriatric Medicine)  Extended Emergency Contact Information Primary Emergency Contact: Blizard,Valerie Address: [address]          [place] [address] [place] Home Phone: [phone] Relation: Spouse  Code Status: full code Goals of Care: Advanced Directive information Advanced Directives 04/11/2015  Does patient have an advance directive? No  Would patient like information on creating an advanced directive? No - patient declined information     Chief Complaint  Patient presents with  . Annual Exam    wellness exam  . MMSE    28/30 failed clock drawing    HPI: Patient is a 82 y.o. male seen in today for an annual wellness exam.    Feels itchy on both arms and around his waist to his back.  Skin is getting dry on his legs also.    Has had an earache, he can't get rid of.  Also has annual sinus congestion.  Taking some mucus pills.  They help with the congestion.  Used to also take allergy pills each spring, too.  Depression screen [facility] 2/9 04/11/2015 04/15/2014 10/15/2013 07/16/2013  Decreased Interest 0 0 0 0  Down, Depressed, Hopeless 0 0 0 0  PHQ - 2 Score 0 0 0 0    Fall Risk  04/11/2015 10/18/2014 04/15/2014 10/15/2013 07/16/2013  Falls in the past year? Yes No No No No  Number falls in past yr: 2 or more - - - -  Injury with Fall? No - - - -  Risk for fall due to : History of fall(s);Impaired balance/gait;Impaired mobility - - - -  Feels more unstable.  Is using a cane every now and then.  Doesn't feel like he always needs it.  Must slow down.  Knees stay sore and left is like someone kicked him in the back of the knee.  Wonders about a knee brace.    MMSE - Mini Mental State Exam 04/11/2015 10/15/2013  Orientation to time 5 5  Orientation to Place  5 5  Registration 3 3  Attention/ Calculation 4 5  Recall 3 1  Language- name 2 objects 2 2  Language- repeat 1 1  Language- follow 3 step command 3 3  Language- read & follow direction 1 1  Write a sentence 1 1  Copy design 0 1  Total score 28 28  Failed clock  Health Maintenance  Topic Date Due  . TETANUS/TDAP  01/20/1953  . ZOSTAVAX  01/20/1994  . PNA vac Low Risk Adult (2 of 2 - PPSV23) 04/15/2015  . INFLUENZA VACCINE  08/02/2015   Urinary incontinence? none Functional Status Survey: Is the patient deaf or have difficulty hearing?: Yes Does the patient have difficulty seeing, even when wearing glasses/contacts?: Yes Does the patient have difficulty concentrating, remembering, or making decisions?: Yes Does the patient have difficulty walking or climbing stairs?: Yes Does the patient have difficulty dressing or bathing?: No Does the patient have difficulty doing errands alone such as visiting a doctor's office or shopping?: No Exercise?  Is physically active at home, [facility] Diet? Is trying to eat more protein in his diet to gain weight  Visual Acuity Screening   Right eye Left eye Both eyes  Without correction:  20/40 20/40 20/40   With correction:     HOH, but does not wear hearing aides as directed. Dentition:  Has dentures and no difficulty chewing Pain:  Chronic, uses only ibuprofen and aspercreme  Past Medical History  Diagnosis Date  . Rheumatoid arthritis involving multiple joints (HCC)   . Ruptured appendix   . Ventral hernia     Past Surgical History  Procedure Laterality Date  . Shoulder surgery Right 1982  . Shoulder surgery Left 1992     Dr. Valentina Gu  . Appendectomy  1978    had peritonitis (ruptured), had exlap  . Hand surgery  2005, 2010    multiple hand joint replacements Dr. Amanda Pea  . Left hip replacement      Dr. Charlann Boxer    Social History   Social History  . Marital Status: Married    Spouse Name: N/A  . Number of Children: N/A  .  Years of Education: N/A   Occupational History  . Not on file.   Social History Main Topics  . Smoking status: Never Smoker   . Smokeless tobacco: Never Used  . Alcohol Use: 0.0 oz/week    0 Standard drinks or equivalent per week     Comment: 1-2 a year  . Drug Use: No  . Sexual Activity: Not on file   Other Topics Concern  . Not on file   Social History Narrative   Chief Technology Officer   Drinks coffee   Is married since 1984   Lives in house with his wife and small dog   2 grown daughters           No Known Allergies    Medication List       This list is accurate as of: 04/11/15 10:28 AM.  Always use your most recent med list.               cetirizine 10 MG tablet  Commonly known as:  ZYRTEC  Take 1 tablet (10 mg total) by mouth daily.     ibuprofen 200 MG tablet  Commonly known as:  ADVIL,MOTRIN  Take 200 mg by mouth as needed.     NEOMYCIN-POLYMYXIN-HYDROCORTISONE 1 % Soln otic solution  Commonly known as:  CORTISPORIN  Place 3 drops into the left ear 4 (four) times daily.         Review of Systems:  Review of Systems  Constitutional: Negative for fever, chills, weight loss and malaise/fatigue.  HENT: Positive for hearing loss. Negative for congestion.   Eyes: Positive for blurred vision.       He does not notice, but was 20/40 on testing today  Respiratory: Negative for shortness of breath.   Cardiovascular: Negative for chest pain and leg swelling.  Gastrointestinal: Negative for abdominal pain, constipation, blood in stool and melena.  Genitourinary: Negative for dysuria.  Musculoskeletal: Positive for joint pain and falls.       Balance problems  Skin: Negative for rash.  Neurological: Negative for dizziness, loss of consciousness and weakness.  Psychiatric/Behavioral: Negative for depression and memory loss. The patient does not have insomnia.     Physical Exam: Filed Vitals:   04/11/15 0927  BP: 120/60  Pulse: 68  Temp: 97.8 F (36.6  C)  TempSrc: Oral  Height: 5\' 7"  (1.702 m)  Weight: 116 lb (52.617 kg)  SpO2: 96%   Body mass index is 18.16 kg/(m^2). Physical Exam  Constitutional: He is oriented to person, place, and time.  Thin white male,  walking w/o assistive device, very stiff at hips and wavering around  HENT:  Head: Normocephalic and atraumatic.  Right Ear: External ear normal.  Nose: Nose normal.  Mouth/Throat: Oropharynx is clear and moist. No oropharyngeal exudate.  Left external auditory canal erythematous  Eyes: Conjunctivae and EOM are normal. Pupils are equal, round, and reactive to light.  Neck: Neck supple. No JVD present. No tracheal deviation present. No thyromegaly present.  Cardiovascular: Normal rate, regular rhythm, normal heart sounds and intact distal pulses.   Pulmonary/Chest: Effort normal and breath sounds normal. No respiratory distress.  Abdominal: Soft. Bowel sounds are normal. He exhibits no distension and no mass. There is no tenderness. There is no rebound and no guarding.  Musculoskeletal: He exhibits tenderness. He exhibits no edema.  Multiple rheumatoid nodules of hands, elbows, feet; severe deformity of joints of hands and feet; decreased ROM at hips seems to cause unsteady gait  Lymphadenopathy:    He has no cervical adenopathy.  Neurological: He is alert and oriented to person, place, and time. No cranial nerve deficit.  Balance poor with walking  Skin: Skin is warm and dry.  Psychiatric: He has a normal mood and affect.    Labs reviewed: Basic Metabolic Panel:  Recent Labs  58/30/94 0904 10/18/14 1107  NA 135 135*  K 4.6 4.2  CL 97 97  CO2 23 24  GLUCOSE 93 93  BUN 19 15  CREATININE 0.68* 0.62*  CALCIUM 8.8 9.0   Liver Function Tests:  Recent Labs  04/15/14 0904 10/18/14 1107  AST 14 15  ALT 6 9  ALKPHOS 68 80  BILITOT 0.2 0.2  PROT 7.6 8.1  ALBUMIN 3.4* 3.6   No results for input(s): LIPASE, AMYLASE in the last 8760 hours. No results for  input(s): AMMONIA in the last 8760 hours. CBC:  Recent Labs  04/15/14 0904  WBC 6.7  NEUTROABS 4.9  HGB 10.8*  HCT 32.1*  MCV 80  PLT 311   Lipid Panel:  Recent Labs  04/15/14 0904 10/18/14 1107  CHOL 100 106  HDL 37* 35*  LDLCALC 54 60  TRIG 43 53  CHOLHDL 2.7 3.0   Lab Results  Component Value Date   HGBA1C 5.2 10/18/2014    Procedures: EKG:  NSR at 54 bpm with PVCs  Assessment/Plan 1. Encounter for Medicare annual wellness exam -see hpi, needs eye exam--says he will make an appt with his wife's ophtho -is HOH also, but did not wear his hearing aides--recommended he do so -given pneumovax today -never got zoster or tdap from previous Rxs provided  - CBC with Differential/Platelet - Comprehensive metabolic panel - Hemoglobin A1c - Lipid panel  2. Rheumatoid arthritis involving multiple sites, unspecified rheumatoid factor presence (HCC) - refuses DMARDs therapy and anything but ibuprofen and aspercreme -at least now agrees to PT for exercises and brace recommendations for left knee and especially for assistive device use -saw rheum in past but has a fear of the medications offered - Ambulatory referral to Physical Therapy - CBC with Differential/Platelet - Comprehensive metabolic panel  3. Hyperglycemia - needs reassessment, trying to eat healthy diet - CBC with Differential/Platelet - Comprehensive metabolic panel - Hemoglobin A1c - Lipid panel  4. Low weight for height -previously encouraged supplements--not doing, but is going to eat steak occasionally at [facility] to get protein  5. Acquired leg length discrepancy - could benefit from PT due to this also - Ambulatory referral to Physical Therapy  6. Posterior left knee  pain - may be due in part to #5 but primarily due to advanced RA with refusal of regular medications--knee injections only helping for short time, left knee very warm and swollen -prednisone is a poor option due to his  hyperglycemia also - Ambulatory referral to Physical Therapy  7. Allergic sinusitis - requests a Rx for allergy medication for sinus congestion, PND - cetirizine (ZYRTEC) 10 MG tablet; Take 1 tablet (10 mg total) by mouth daily.  Dispense: 30 tablet; Refill: 2  8. Otitis externa of left ear -has pain and redness in auditory canal, decreased hearing (hearing loss not new) - NEOMYCIN-POLYMYXIN-HYDROCORTISONE (CORTISPORIN) 1 % SOLN otic solution; Place 3 drops into the left ear 4 (four) times daily.  Dispense: 10 mL; Refill: 0  9. Need for prophylactic vaccination against Streptococcus pneumoniae (pneumococcus) -given today  Labs/tests ordered:   Orders Placed This Encounter  Procedures  . Pneumococcal polysaccharide vaccine 23-valent greater than or equal to 2yo subcutaneous/IM  . CBC with Differential/Platelet  . Comprehensive metabolic panel    Order Specific Question:  Has the patient fasted?    Answer:  Yes  . Hemoglobin A1c  . Lipid panel    Order Specific Question:  Has the patient fasted?    Answer:  Yes  . Ambulatory referral to Physical Therapy    Referral Priority:  Routine    Referral Type:  Physical Medicine    Referral Reason:  Specialty Services Required    Requested Specialty:  Physical Therapy    Number of Visits Requested:  1  . EKG 12-Lead    Next appt:  3 mos for med mgt  Aaniyah Strohm L. Zarinah Oviatt, D.O. Geriatrics [facility] [address], [place] [address] Cell Phone (Mon-Fri 8am-5pm):  [phone] On Call:  [phone] & follow prompts after 5pm & weekends Office Phone:  [phone] Office Fax:  [phone]

## 2015-04-11 NOTE — Patient Instructions (Addendum)
Please see your wife's eye doctor for an exam and ask them to send me a note.  Also, use the drops I sent to your pharmacy in your left ear for external ear infection.  You got your second pneumonia shot today.  Please go to physical therapy as I recommended--a referral was entered and you will hear from our office about that.    I have ordered cetirizine for you allergies and congestion.

## 2015-04-11 NOTE — Progress Notes (Signed)
Patient ID: [name], male   DOB: 08-26-1932, 82 y.o.   MRN: [id number]  MMSE failed clock drawing 28/30

## 2015-04-12 LAB — CBC WITH DIFFERENTIAL/PLATELET
Basophils Absolute: 0 10*3/uL (ref 0.0–0.2)
Basos: 1 %
EOS (ABSOLUTE): 0.2 10*3/uL (ref 0.0–0.4)
Eos: 4 %
Hematocrit: 34 % — ABNORMAL LOW (ref 37.5–51.0)
Hemoglobin: 10.9 g/dL — ABNORMAL LOW (ref 12.6–17.7)
Immature Grans (Abs): 0 10*3/uL (ref 0.0–0.1)
Immature Granulocytes: 0 %
Lymphocytes Absolute: 1.2 10*3/uL (ref 0.7–3.1)
Lymphs: 19 %
MCH: 25.8 pg — ABNORMAL LOW (ref 26.6–33.0)
MCHC: 32.1 g/dL (ref 31.5–35.7)
MCV: 81 fL (ref 79–97)
Monocytes Absolute: 0.5 10*3/uL (ref 0.1–0.9)
Monocytes: 7 %
Neutrophils Absolute: 4.5 10*3/uL (ref 1.4–7.0)
Neutrophils: 69 %
Platelets: 308 10*3/uL (ref 150–379)
RBC: 4.22 x10E6/uL (ref 4.14–5.80)
RDW: 14.7 % (ref 12.3–15.4)
WBC: 6.5 10*3/uL (ref 3.4–10.8)

## 2015-04-12 LAB — COMPREHENSIVE METABOLIC PANEL
ALT: 7 IU/L (ref 0–44)
AST: 10 IU/L (ref 0–40)
Albumin/Globulin Ratio: 0.9 — ABNORMAL LOW (ref 1.2–2.2)
Albumin: 3.6 g/dL (ref 3.5–4.7)
Alkaline Phosphatase: 77 IU/L (ref 39–117)
BUN/Creatinine Ratio: 28 — ABNORMAL HIGH (ref 10–24)
BUN: 19 mg/dL (ref 8–27)
Bilirubin Total: <0.2 mg/dL (ref 0.0–1.2)
CO2: 22 mmol/L (ref 18–29)
Calcium: 9 mg/dL (ref 8.6–10.2)
Chloride: 96 mmol/L (ref 96–106)
Creatinine, Ser: 0.68 mg/dL — ABNORMAL LOW (ref 0.76–1.27)
GFR calc Af Amer: 104 mL/min/{1.73_m2} (ref >59)
GFR calc non Af Amer: 90 mL/min/{1.73_m2} (ref >59)
Globulin, Total: 4 g/dL (ref 1.5–4.5)
Glucose: 87 mg/dL (ref 65–99)
Potassium: 4.4 mmol/L (ref 3.5–5.2)
Sodium: 135 mmol/L (ref 134–144)
Total Protein: 7.6 g/dL (ref 6.0–8.5)

## 2015-04-12 LAB — HEMOGLOBIN A1C
Est. average glucose Bld gHb Est-mCnc: 117 mg/dL
Hgb A1c MFr Bld: 5.7 % — ABNORMAL HIGH (ref 4.8–5.6)

## 2015-04-12 LAB — LIPID PANEL
Chol/HDL Ratio: 2.9 ratio units (ref 0.0–5.0)
Cholesterol, Total: 104 mg/dL (ref 100–199)
HDL: 36 mg/dL — ABNORMAL LOW (ref >39)
LDL Calculated: 54 mg/dL (ref 0–99)
Triglycerides: 71 mg/dL (ref 0–149)
VLDL Cholesterol Cal: 14 mg/dL (ref 5–40)

## 2015-04-28 ENCOUNTER — Ambulatory Visit: Payer: Federal, State, Local not specified - PPO | Attending: Internal Medicine | Admitting: Physical Therapy

## 2015-04-28 ENCOUNTER — Encounter: Payer: Self-pay | Admitting: Physical Therapy

## 2015-04-28 DIAGNOSIS — M25562 Pain in left knee: Secondary | ICD-10-CM

## 2015-04-28 DIAGNOSIS — M25561 Pain in right knee: Secondary | ICD-10-CM | POA: Diagnosis not present

## 2015-04-28 NOTE — Therapy (Addendum)
[facility] [address] [place], [place], [address] Phone: [phone]   Fax:  [phone]  Physical Therapy Evaluation  Patient Details  Name: [name] MRN: [id number] Date of Birth: September 09, 1932 Referring Provider: Dr. Heath Lark  Encounter Date: 04/28/2015      PT End of Session - 04/28/15 1307    Visit Number 1   Number of Visits 10   Date for PT Re-Evaluation 05/26/15   PT Start Time 1230   PT Stop Time 1305   PT Time Calculation (min) 35 min   Activity Tolerance Patient tolerated treatment well   Behavior During Therapy [facility] for tasks assessed/performed      Past Medical History  Diagnosis Date  . Rheumatoid arthritis involving multiple joints ([place])   . Ruptured appendix   . Ventral hernia     Past Surgical History  Procedure Laterality Date  . Shoulder surgery Right 1982  . Shoulder surgery Left 1992     Dr. Lorre Nick  . Appendectomy  1978    had peritonitis (ruptured), had exlap  . Hand surgery  2005, 2010    multiple hand joint replacements Dr. Amedeo Plenty  . Left hip replacement      Dr. Alvan Dame    There were no vitals filed for this visit.       Subjective Assessment - 04/28/15 1233    Subjective Patient reports pain in knees and has RA.  Bil. shoulders replaced and hip.  When wake up in morning stiff knees. Touble with stretching out straight with knees.  [place] [address] 2 times in the past 6 months and learning to look down to see where fet are.    Patient Stated Goals feel better and decrease pain in knees   Currently in Pain? Yes   Pain Score 2    Pain Location Knee   Pain Orientation Left;Right   Pain Type Chronic pain   Pain Frequency Intermittent   Aggravating Factors  stretching legs out    Pain Relieving Factors rest            [facility] PT Assessment - 04/28/15 0001    Assessment   Medical Diagnosis M06.9 Rheumatoid arthrits involvingmultiple sites, unspecified, rheumatoid factor  presence; M2.70 acquired leg length discrepancy; M25.562 posterior knee pain   Referring Provider Dr. Heath Lark   Onset Date/Surgical Date 11/02/15   Prior Therapy None   Precautions   Precautions Other (comment)   Precaution Comments RA   Restrictions   Weight Bearing Restrictions No   Balance Screen   Has the patient fallen in the past 6 months Yes  did not look where he is going   How many times? 2   Has the patient had a decrease in activity level because of a fear of falling?  No   Is the patient reluctant to leave their home because of a fear of falling?  No   Home Ecologist residence   Prior Function   Level of Independence Independent   Cognition   Overall Cognitive Status Within Functional Limits for tasks assessed   Observation/Other Assessments   Focus on Therapeutic Outcomes (FOTO)  61% limitation   ROM / Strength   AROM / PROM / Strength AROM;PROM;Strength   AROM   Right Knee Extension -20   Right Knee Flexion 108   Left Knee Extension -20   Left Knee Flexion 100   PROM   Right Knee Extension -15  Right Knee Flexion 110   Left Knee Extension -15   Left Knee Flexion 115   Strength   Right Knee Flexion 4/5   Right Knee Extension 4/5   Left Knee Flexion 4/5   Left Knee Extension 4/5   Ambulation/Gait   Assistive device None   Gait Pattern Step-through pattern;Decreased stride length;Decreased hip/knee flexion - right;Decreased hip/knee flexion - left;Decreased dorsiflexion - right;Decreased dorsiflexion - left   Balance   Balance Assessed Yes   Standardized Balance Assessment   Standardized Balance Assessment Timed Up and Go Test;Dynamic Gait Index   Dynamic Gait Index   Level Surface Mild Impairment   Change in Gait Speed Mild Impairment   Gait with Horizontal Head Turns Mild Impairment   Gait with Vertical Head Turns Mild Impairment   Gait and Pivot Turn Moderate Impairment   Step Over Obstacle Moderate Impairment    Step Around Obstacles Moderate Impairment   Steps Severe Impairment   Total Score 11   Timed Up and Go Test   TUG Normal TUG   Normal TUG (seconds) 12   TUG Comments stumbles to turn to sit down      G-Code: Functional assessment tool used is FOTO score is 61% limitation; functional limitation Mobility;walking and moving around, goal is CK, discharge status is CL. Earlie Counts, PT 05/02/2015 8:23 AM                       PT Education - 04/28/15 1309    Education provided Yes   Education Details LAQ, Hip flexion   Person(s) Educated Patient   Methods Explanation;Demonstration;Handout;Verbal cues   Comprehension Returned demonstration;Verbalized understanding             PT Long Term Goals - 04/28/15 1309    PT LONG TERM GOAL #1   Title independent with HEP and understand how to progress himself    Time 4   Period Weeks   Status New   PT LONG TERM GOAL #2   Title understand tips to avoid falls   Time 4   Period Weeks   Status New   PT LONG TERM GOAL #3   Title understand balance exercises to reduce risk of falls   Time 4   Period Weeks   Status New               Plan - 04/28/15 1311    Clinical Impression Statement Patient is a 82 year old male with diagnosis of knee pain and rheumatoid arthritis.  Patient knee pain is 3/10 intermittently.  Patient has decreased ROM of bilateral knees and strength  making it difficulty to walk  quickly.  He has to walk slowly and focus on where his step is. Patient has fallen two times due to  not looking where he goes.  Patient has to rock two times to sit up due to the restrictions in his joint. Patient loses his balance when pivoting and changing direction with walking.  Patient is of moderate complexity due to his arthritis, limited joints, limited mobility, risk of falls and mobility decreasing. Patient will benefit form physical therapy to improve mobility and decrease pain.     Rehab Potential Good    Clinical Impairments Affecting Rehab Potential RA and joints are limited   PT Frequency 1x / week   PT Duration 4 weeks   PT Treatment/Interventions Therapeutic exercise;Therapeutic activities;Neuromuscular re-education;Patient/family education   PT Next Visit Plan flexibility exercises, knee exercises, balance exericses;  knee ROM exercises   Recommended Other Services None   Consulted and Agree with Plan of Care Patient      Patient will benefit from skilled therapeutic intervention in order to improve the following deficits and impairments:  Pain, Decreased mobility, Decreased range of motion, Decreased balance, Impaired flexibility, Decreased strength  Visit Diagnosis: Pain in right knee - Plan: PT plan of care cert/re-cert  Pain in left knee - Plan: PT plan of care cert/re-cert      G-Codes - 44/36/01 1306    Functional Assessment Tool Used FOTO score is 61% limitation   Functional Limitation Mobility: Walking and moving around   Mobility: Walking and Moving Around Current Status [phone]) At least 60 percent but less than 80 percent impaired, limited or restricted   Mobility: Walking and Moving Around Goal Status [phone]) At least 40 percent but less than 60 percent impaired, limited or restricted       Problem List Patient Active Problem List   Diagnosis Date Noted  . Posterior left knee pain 04/11/2015  . Allergic sinusitis 04/11/2015  . Otitis externa of left ear 04/11/2015  . Low weight for height 10/18/2014  . Rheumatoid arthritis involving multiple sites ([place]) 10/18/2014  . Left groin hernia 10/18/2014  . Hyperglycemia 10/15/2013  . Advanced care planning/counseling discussion 10/15/2013  . Loss of weight 07/16/2013  . Acquired leg length discrepancy 07/16/2013  . Rheumatoid arthritis ([place]) 12/12/2008  . Ventral hernia without obstruction or gangrene 12/10/2008    Earlie Counts, PT 04/28/2015 1:21 PM   [facility] [address] [place], [place], [address] Phone: [phone]   Fax:  [phone]  Name: [name] MRN: [id number] Date of Birth: 04-29-1932   PHYSICAL THERAPY DISCHARGE SUMMARY  Visits from Start of Care: 1  Current functional level related to goals / functional outcomes: See above.  Patient only attended the initial evaluation.  Patient called to report the therapy is not for him and does not want to come.    Remaining deficits: See above.   Education / Equipment: HEP  Plan: Patient agrees to discharge.  Patient goals were not met. Patient is being discharged due to the patient's request. Thank you for the referral. Earlie Counts, PT 05/02/2015 8:22 AM   ?????

## 2015-04-28 NOTE — Patient Instructions (Signed)
Knee Extension (Sitting)    Place __0__ pound weight on left ankle and straighten knee fully, lower slowly. Repeat _20___ times per set. Do 1____ sets per session. Do __1__ sessions per day.  http://orth.exer.us/732   Copyright  VHI. All rights reserved.    FLEXION: Sitting (Active)    Sit, both feet flat. Lift right knee toward ceiling. Use _0__ lbs. Complete _20__ sets of _1__ repetitions. Perform _1__ sessions per day.  Copyright  VHI. All rights reserved.  [facility] [address] [place], [place] [address] Phone # [phone] Fax [phone]

## 2015-05-04 ENCOUNTER — Encounter: Payer: Federal, State, Local not specified - PPO | Admitting: Physical Therapy

## 2015-05-31 ENCOUNTER — Other Ambulatory Visit: Payer: Self-pay | Admitting: Internal Medicine

## 2015-05-31 DIAGNOSIS — N6320 Unspecified lump in the left breast, unspecified quadrant: Secondary | ICD-10-CM

## 2015-05-31 DIAGNOSIS — N644 Mastodynia: Secondary | ICD-10-CM

## 2015-06-03 ENCOUNTER — Ambulatory Visit
Admission: RE | Admit: 2015-06-03 | Discharge: 2015-06-03 | Disposition: A | Discharge status: Home / Self Care | Payer: Federal, State, Local not specified - PPO | Source: Ambulatory Visit | Attending: Internal Medicine | Admitting: Internal Medicine

## 2015-06-03 ENCOUNTER — Other Ambulatory Visit: Payer: Self-pay | Admitting: Internal Medicine

## 2015-06-03 DIAGNOSIS — N644 Mastodynia: Secondary | ICD-10-CM

## 2015-06-03 DIAGNOSIS — N6320 Unspecified lump in the left breast, unspecified quadrant: Secondary | ICD-10-CM

## 2015-07-18 ENCOUNTER — Ambulatory Visit: Payer: Federal, State, Local not specified - PPO | Admitting: Internal Medicine

## 2018-03-26 ENCOUNTER — Telehealth: Payer: Self-pay | Admitting: *Deleted

## 2018-03-26 NOTE — Telephone Encounter (Signed)
Left message on machine regarding his appointment, ask that he call back as soon as possible.

## 2018-03-28 ENCOUNTER — Ambulatory Visit: Payer: Federal, State, Local not specified - PPO | Admitting: Nurse Practitioner

## 2018-04-02 ENCOUNTER — Ambulatory Visit (INDEPENDENT_AMBULATORY_CARE_PROVIDER_SITE_OTHER): Payer: Federal, State, Local not specified - PPO | Admitting: Podiatry

## 2018-04-02 ENCOUNTER — Other Ambulatory Visit: Payer: Self-pay

## 2018-04-02 VITALS — BP 145/67 | HR 50 | Temp 98.3°F

## 2018-04-02 DIAGNOSIS — M79675 Pain in left toe(s): Secondary | ICD-10-CM

## 2018-04-02 DIAGNOSIS — M79674 Pain in right toe(s): Secondary | ICD-10-CM | POA: Diagnosis not present

## 2018-04-02 DIAGNOSIS — M0579 Rheumatoid arthritis with rheumatoid factor of multiple sites without organ or systems involvement: Secondary | ICD-10-CM | POA: Diagnosis not present

## 2018-04-02 DIAGNOSIS — B351 Tinea unguium: Secondary | ICD-10-CM | POA: Diagnosis not present

## 2018-04-06 NOTE — Progress Notes (Signed)
Subjective: [name] presents today with cc of painful, discolored, thick toenails which interfere with daily activities.  Pain is aggravated when wearing enclosed shoe gear.   He relates he has RA and can no longer cut his toenails.  Past Medical History:  Diagnosis Date  . Rheumatoid arthritis involving multiple joints (HCC)   . Ruptured appendix   . Ventral hernia      Patient Active Problem List   Diagnosis Date Noted  . Posterior left knee pain 04/11/2015  . Allergic sinusitis 04/11/2015  . Otitis externa of left ear 04/11/2015  . Low weight for height 10/18/2014  . Rheumatoid arthritis involving multiple sites (HCC) 10/18/2014  . Left groin hernia 10/18/2014  . Hyperglycemia 10/15/2013  . Advanced care planning/counseling discussion 10/15/2013  . Loss of weight 07/16/2013  . Acquired leg length discrepancy 07/16/2013  . Rheumatoid arthritis (HCC) 12/12/2008  . Ventral hernia without obstruction or gangrene 12/10/2008     Past Surgical History:  Procedure Laterality Date  . APPENDECTOMY  1978   had peritonitis (ruptured), had exlap  . HAND SURGERY  2005, 2010   multiple hand joint replacements Dr. Amanda Pea  . left hip replacement     Dr. Charlann Boxer  . SHOULDER SURGERY Right 1982  . SHOULDER SURGERY Left 1992    Dr. Valentina Gu      Current Outpatient Medications:  .  cetirizine (ZYRTEC) 10 MG tablet, Take 1 tablet (10 mg total) by mouth daily. (Patient not taking: Reported on 04/28/2015), Disp: 30 tablet, Rfl: 2 .  ibuprofen (ADVIL,MOTRIN) 200 MG tablet, Take 200 mg by mouth as needed. Reported on 04/28/2015, Disp: , Rfl:  .  NEOMYCIN-POLYMYXIN-HYDROCORTISONE (CORTISPORIN) 1 % SOLN otic solution, Place 3 drops into the left ear 4 (four) times daily. (Patient not taking: Reported on 04/28/2015), Disp: 10 mL, Rfl: 0   No Known Allergies   Social History   Occupational History  . Not on file  Tobacco Use  . Smoking status: Never Smoker  . Smokeless tobacco: Never Used   Substance and Sexual Activity  . Alcohol use: Yes    Alcohol/week: 0.0 standard drinks    Comment: 1-2 a year  . Drug use: No  . Sexual activity: Not on file     Family History  Problem Relation Age of Onset  . Mental illness Sister   . Diabetes Sister      Immunization History  Administered Date(s) Administered  . Influenza,inj,Quad PF,6+ Mos 10/18/2014  . Influenza-Unspecified 10/01/2013  . Pneumococcal Conjugate-13 04/15/2014  . Pneumococcal Polysaccharide-23 04/11/2015     Review of systems: Positive Findings in bold print.  Constitutional:  chills, fatigue, fever, sweats, weight change Communication: Nurse, learning disability, sign Presenter, broadcasting, hand writing, iPad/Android device Head: headaches, head injury Eyes: changes in vision, eye pain, glaucoma, cataracts, macular degeneration, diplopia, glare,  light sensitivity, eyeglasses or contacts, blindness Ears nose mouth throat: hearing impaired, hearing aids,  ringing in ears, deaf, sign language,  vertigo,   nosebleeds,  rhinitis,  cold sores, snoring, swollen glands Cardiovascular: HTN, edema, arrhythmia, pacemaker in place, defibrillator in place, chest pain/tightness, chronic anticoagulation, blood clot, heart failure, MI Peripheral Vascular: leg cramps, varicose veins, blood clots, lymphedema, varicosities Respiratory:  difficulty breathing, denies congestion, SOB, wheezing, cough, emphysema Gastrointestinal: change in appetite or weight, abdominal pain, constipation, diarrhea, nausea, vomiting, vomiting blood, change in bowel habits, abdominal pain, jaundice, rectal bleeding, hemorrhoids, GERD Genitourinary:  nocturia,  pain on urination, polyuria,  blood in urine, Foley catheter, urinary urgency, ESRD  on hemodialysis Musculoskeletal: amputation, cramping, stiff joints, painful joints, decreased joint motion, fractures, OA, gout, hemiplegia, paraplegia, uses cane, wheelchair bound, uses walker, uses rollator Skin: +changes in  toenails, color change, dryness, itching, mole changes,  rash, wound(s) Neurological: headaches, numbness in feet, paresthesias in feet, burning in feet, fainting,  seizures, change in speech. denies headaches, memory problems/poor historian, cerebral palsy, weakness, paralysis, CVA, TIA Endocrine: diabetes, hypothyroidism, hyperthyroidism,  goiter, dry mouth, flushing, heat intolerance,  cold intolerance,  excessive thirst, denies polyuria,  nocturia Hematological:  easy bleeding, excessive bleeding, easy bruising, enlarged lymph nodes, on long term blood thinner, history of past transusions Allergy/immunological:  hives, eczema, frequent infections, multiple drug allergies, seasonal allergies, transplant recipient, multiple food allergies Psychiatric:  anxiety, depression, mood disorder, suicidal ideations, hallucinations, insomnia  Objective: Vascular Examination: Capillary refill time immediate x 10 digits.  Dorsalis pedis pulses palpable b/l.   Posterior tibial pulses palpable b/l.   No digital hair x 10 digits.  Skin temperature gradient WNL b/l  Dermatological Examination: Skin with normal turgor, texture and tone b/l.  Toenails 1-5 b/l discolored, thick, dystrophic with subungual debris and pain with palpation to nailbeds due to thickness of nails.  Musculoskeletal: Muscle strength 5/5 to all LE muscle groups.  Lateral deviation of lesser digits 2-5 b/l consistent with RA.  Neurological: Sensation intact with 10 gram monofilament.  Vibratory sensation intact.  Assessment: 1. Painful onychomycosis toenails 1-5 b/l  2. Rheaumatoid arthritis  Plan: 1. Discussed onychomycosis and treatment options.  Literature dispensed on today. 2. Toenails 1-5 b/l were debrided in length and girth without iatrogenic bleeding. 3. Patient to continue soft, supportive shoe gear daily. 4. Patient to report any pedal injuries to medical professional immediately. 5. Follow up 3 months.   6. Patient/POA to call should there be a concern in the interim.

## 2018-04-07 ENCOUNTER — Encounter: Payer: Self-pay | Admitting: Podiatry

## 2018-05-09 ENCOUNTER — Ambulatory Visit: Payer: Federal, State, Local not specified - PPO | Admitting: Family

## 2018-06-13 ENCOUNTER — Ambulatory Visit: Payer: Federal, State, Local not specified - PPO | Admitting: Family

## 2018-06-18 ENCOUNTER — Ambulatory Visit: Payer: Federal, State, Local not specified - PPO | Admitting: Family

## 2018-07-01 ENCOUNTER — Encounter: Payer: Self-pay | Admitting: Family

## 2018-07-01 ENCOUNTER — Other Ambulatory Visit: Payer: Self-pay

## 2018-07-01 ENCOUNTER — Ambulatory Visit (INDEPENDENT_AMBULATORY_CARE_PROVIDER_SITE_OTHER): Payer: Federal, State, Local not specified - PPO | Admitting: Family

## 2018-07-01 VITALS — BP 130/70 | HR 92 | Temp 97.8°F | Ht 67.0 in | Wt 122.2 lb

## 2018-07-01 DIAGNOSIS — K4090 Unilateral inguinal hernia, without obstruction or gangrene, not specified as recurrent: Secondary | ICD-10-CM

## 2018-07-01 DIAGNOSIS — R2681 Unsteadiness on feet: Secondary | ICD-10-CM

## 2018-07-01 DIAGNOSIS — M0579 Rheumatoid arthritis with rheumatoid factor of multiple sites without organ or systems involvement: Secondary | ICD-10-CM

## 2018-07-01 DIAGNOSIS — R739 Hyperglycemia, unspecified: Secondary | ICD-10-CM | POA: Diagnosis not present

## 2018-07-01 DIAGNOSIS — Z9181 History of falling: Secondary | ICD-10-CM

## 2018-07-01 DIAGNOSIS — R1312 Dysphagia, oropharyngeal phase: Secondary | ICD-10-CM

## 2018-07-01 NOTE — Progress Notes (Addendum)
Provider: Richarda Blade FNP-C   Damier Disano, Donalee Citrin, NP  Patient Care Team: Tayshun Gappa, Donalee Citrin, NP as PCP - General (Family Medicine)  Extended Emergency Contact Information Primary Emergency Contact: Hasting,Valerie Address: [address]          [place] [address] [place] Home Phone: [phone] Relation: Spouse  Goals of care: Advanced Directive information Advanced Directives 04/28/2015  Does Patient Have a Medical Advance Directive? No  Would patient like information on creating a medical advance directive? No - patient declined information     Chief Complaint  Patient presents with   New Patient (Initial Visit)    Establish Care, needs scat fill out, and respite care     HPI:  Pt is a 85 y.o. male seen today to establish care with [facility] for medical management of chronic diseases.He is here today with his wife.He states was following up with PCP at an urgent care but did agree with was told to find another PCP.He denies any acute issues.He request SCAT forms to be filled out.He states does not drive and unable to walk long distance due to rheumatoid arthritis on both knees.Patient's wife states patient has had multiple falls and gait seems to be worsening.He usually tries to get up by himself when he falls but with his worsening RA on fingers it's becoming difficulty for him to push up. No injuries with falls.she also request assistance with his ADL's due to RA sometimes unable to remove pants on time to use the bathroom and unable to apply his hearing aids or button his shirt and pants.Wife takes care of the cooking.she is afraid to leave him alone whenever she goes out to run errands for fear of him falling then won't be able to get up by himself.He does not take any pain medication or follow up with rheumatology.He states has used methotrexate in the past but stopped due to side effects.He does not want anymore referral to Rheumatologist.Pain is under control with  Advil as needed.He prefers Advil than Tylenol. No weight loss.Describes appetite as good. Left Groin Hernia - chronic states was told to monitor.He states able to push it back.He denies any issues with constipation.he states was told he was not a surgical candidate in the past.  Dysphagia- He states has difficulty swallowing dry food but he can swallow moist food without any difficulties.states swallowing problems due to rheumatoid arthritis.No cough or signs of aspiration with thin liquids.     Immunization - Available records indicate no Tdap but thinks he has had it recently.He states does not take flu shot and shingles vaccine.He states has age out for colonoscopy.will need records from previous PCP.  Past Medical History:  Diagnosis Date   Rheumatoid arthritis involving multiple joints (HCC)    Ruptured appendix    Ventral hernia    Past Surgical History:  Procedure Laterality Date   APPENDECTOMY  1978   had peritonitis (ruptured), had exlap   HAND SURGERY  2005, 2010   multiple hand joint replacements Dr. Amanda Pea   left hip replacement     Dr. Charlann Boxer   SHOULDER SURGERY Right 1982   SHOULDER SURGERY Left 1992    Dr. Valentina Gu    No Known Allergies  Allergies as of 07/01/2018   No Known Allergies     Medication List       Accurate as of July 01, 2018 11:41 AM. If you have any questions, ask your nurse or doctor.  ibuprofen 200 MG tablet Commonly known as: ADVIL Take 200 mg by mouth as needed. Reported on 04/28/2015       Review of Systems  Constitutional: Negative for appetite change, chills, fatigue and fever.  HENT: Positive for hearing loss and trouble swallowing. Negative for congestion, rhinorrhea, sinus pressure, sinus pain, sneezing and sore throat.        Left ear HOH  Difficult swallowing when food is dry but moist food is okay  Eyes: Negative for discharge, redness, itching and visual disturbance.  Respiratory: Negative for cough, chest tightness,  shortness of breath and wheezing.   Cardiovascular: Negative for chest pain, palpitations and leg swelling.  Gastrointestinal: Negative for abdominal distention, abdominal pain, constipation, diarrhea, nausea and vomiting.  Endocrine: Negative for cold intolerance, heat intolerance, polydipsia, polyphagia and polyuria.  Genitourinary: Positive for frequency. Negative for difficulty urinating, dysuria, flank pain and urgency.  Musculoskeletal: Positive for arthralgias, gait problem and joint swelling.       Fell 6 months ago had skin tears to left upper arm dressed by paramedic.Had a tattoo on left upper arm that skin tear took off.   Skin: Negative for color change, pallor and rash.  Neurological: Negative for dizziness, syncope, weakness, light-headedness and headaches.  Hematological: Does not bruise/bleed easily.  Psychiatric/Behavioral: Negative for agitation, confusion and sleep disturbance. The patient is not nervous/anxious.     Immunization History  Administered Date(s) Administered   Influenza,inj,Quad PF,6+ Mos 10/18/2014   Influenza-Unspecified 10/01/2013   Pneumococcal Conjugate-13 04/15/2014   Pneumococcal Polysaccharide-23 04/11/2015   Pertinent  Health Maintenance Due  Topic Date Due   INFLUENZA VACCINE  08/02/2018   PNA vac Low Risk Adult  Completed   Fall Risk  04/11/2015 10/18/2014 04/15/2014 10/15/2013 07/16/2013  Falls in the past year? Yes No No No No  Number falls in past yr: 2 or more - - - -  Injury with Fall? No - - - -  Risk for fall due to : History of fall(s);Impaired balance/gait;Impaired mobility - - - -    Vitals:   07/01/18 1107  BP: 130/70  Pulse: 92  Temp: 97.8 F (36.6 C)  TempSrc: Oral  SpO2: 94%  Weight: 122 lb 3.2 oz (55.4 kg)  Height: 5\' 7"  (1.702 m)   Body mass index is 19.14 kg/m. Physical Exam Constitutional:      General: He is not in acute distress.    Appearance: He is normal weight. He is not ill-appearing.  HENT:      Head: Normocephalic.     Right Ear: Tympanic membrane, ear canal and external ear normal. There is no impacted cerumen.     Left Ear: Tympanic membrane, ear canal and external ear normal. There is no impacted cerumen.     Ears:     Comments: HOH no hearing aids.     Nose: Nose normal. No congestion or rhinorrhea.     Mouth/Throat:     Mouth: Mucous membranes are moist.     Pharynx: Oropharynx is clear. No oropharyngeal exudate or posterior oropharyngeal erythema.  Eyes:     General: No scleral icterus.       Right eye: No discharge.        Left eye: No discharge.     Extraocular Movements: Extraocular movements intact.     Conjunctiva/sclera: Conjunctivae normal.     Pupils: Pupils are equal, round, and reactive to light.  Neck:     Musculoskeletal: Decreased range of motion. No neck rigidity, crepitus,  pain with movement, spinous process tenderness or muscular tenderness.     Vascular: No carotid bruit.     Comments: Unable to look over shoulder due to Arthritic pain on the neck  Cardiovascular:     Rate and Rhythm: Normal rate and regular rhythm.     Pulses: Normal pulses.     Heart sounds: Normal heart sounds. No murmur. No gallop.   Pulmonary:     Effort: Pulmonary effort is normal. No respiratory distress.     Breath sounds: Normal breath sounds. No wheezing, rhonchi or rales.  Chest:     Chest wall: No tenderness.  Abdominal:     General: Bowel sounds are normal. There is no distension.     Palpations: Abdomen is soft. There is no mass.     Tenderness: There is no abdominal tenderness. There is no right CVA tenderness, left CVA tenderness, guarding or rebound.     Hernia: A hernia is present. Hernia is present in the left inguinal area.     Comments: Left inguinal hernia bulging but reducible  Musculoskeletal:        General: No tenderness.     Right knee: He exhibits decreased range of motion and swelling. He exhibits no effusion, no erythema and no LCL laxity. No  tenderness found.     Left knee: He exhibits decreased range of motion. He exhibits no swelling, no effusion, no erythema and normal patellar mobility. No tenderness found.     Right lower leg: No edema.     Left lower leg: No edema.     Comments: Unsteady gait walks with a cane.Arthritic changes to fingers and knees. Tophi noted on right index finger and top of left knee.    Lymphadenopathy:     Cervical: No cervical adenopathy.  Skin:    General: Skin is warm and dry.     Coloration: Skin is not pale.     Findings: No bruising, erythema or rash.     Comments: Old surgical incision to hands and mid lower abdomen.  Neurological:     Mental Status: He is alert and oriented to person, place, and time.     Cranial Nerves: No cranial nerve deficit.     Sensory: No sensory deficit.     Motor: No weakness.     Gait: Gait abnormal.     Comments: HOH   Psychiatric:        Mood and Affect: Mood normal.        Behavior: Behavior normal.        Thought Content: Thought content normal.        Judgment: Judgment normal.    Labs reviewed: No results for input(s): NA, K, CL, CO2, GLUCOSE, BUN, CREATININE, CALCIUM, MG, PHOS in the last 8760 hours. No results for input(s): AST, ALT, ALKPHOS, BILITOT, PROT, ALBUMIN in the last 8760 hours. No results for input(s): WBC, NEUTROABS, HGB, HCT, MCV, PLT in the last 8760 hours. Lab Results  Component Value Date   TSH 4.420 07/16/2013   Lab Results  Component Value Date   HGBA1C 5.7 (H) 04/11/2015   Lab Results  Component Value Date   CHOL 104 04/11/2015   HDL 36 (L) 04/11/2015   LDLCALC 54 04/11/2015   TRIG 71 04/11/2015   CHOLHDL 2.9 04/11/2015    Significant Diagnostic Results in last 30 days:  No results found.  Assessment/Plan 1. Rheumatoid arthritis involving multiple sites with positive rheumatoid factor (HCC) Multiple sites including neck,hands,knees and  back.Severe arthritic changes to fingers and both knees.Advil  effective.Recommended Extra strength tylenol for pain but states Advil works better.Advised to take Advil with meals to prevent GI bleeding.Decline follow up with Rheumatology. CBC/diff,future  CMP, future  - PT/OT for evaluation of Activity of daily living,gait stability,ROM,exercise and muscle strengthening. - HH nursing assistant for assistance with his activity of living. - SCAT bus forms filled and signed for assistance with transportation due to unsteady gait ,knee pain and high risk for falls.   2. Hyperglycemia Discussed low carbo and low  concentrated sweets/drinks or snacks. CMP,future will add Hgb A1C to lab work  if his glucose is high.    3. Left groin hernia Chronic.No issues with constipation.Bulging,non tender and reducible.Advised to go to ED if he has worsening swelling or unable to reduce hernia.   4. Dysphagia  Chronic.Abel to swallow moist foods without any problems but not dry foods.No aspiration with thin liquids.   Family/ staff Communication: Reviewed plan of care with patient and wife.  Labs/tests ordered:  CBC/diff,future  CMP, future   Dyanne Yorks C Freddy Spadafora, NP  Addendum: Patient called [facility] office reported to CMA that his left inguinal hernia was hard and unable to push back except when lying down.Advised to get evaluated at the ED but patient declined.Patient requested referral to specialist for further evaluation.

## 2018-07-02 ENCOUNTER — Ambulatory Visit (INDEPENDENT_AMBULATORY_CARE_PROVIDER_SITE_OTHER): Payer: Federal, State, Local not specified - PPO | Admitting: Podiatry

## 2018-07-02 ENCOUNTER — Encounter: Payer: Self-pay | Admitting: Podiatry

## 2018-07-02 VITALS — Temp 98.6°F

## 2018-07-02 DIAGNOSIS — M79674 Pain in right toe(s): Secondary | ICD-10-CM

## 2018-07-02 DIAGNOSIS — M79675 Pain in left toe(s): Secondary | ICD-10-CM

## 2018-07-02 DIAGNOSIS — B351 Tinea unguium: Secondary | ICD-10-CM

## 2018-07-02 NOTE — Patient Instructions (Signed)

## 2018-07-03 ENCOUNTER — Telehealth: Payer: Self-pay | Admitting: Family

## 2018-07-03 ENCOUNTER — Telehealth: Payer: Self-pay

## 2018-07-03 ENCOUNTER — Other Ambulatory Visit: Payer: Federal, State, Local not specified - PPO

## 2018-07-03 ENCOUNTER — Other Ambulatory Visit: Payer: Self-pay

## 2018-07-03 DIAGNOSIS — M0579 Rheumatoid arthritis with rheumatoid factor of multiple sites without organ or systems involvement: Secondary | ICD-10-CM

## 2018-07-03 DIAGNOSIS — R739 Hyperglycemia, unspecified: Secondary | ICD-10-CM

## 2018-07-03 NOTE — Telephone Encounter (Signed)
Spoke to [name] and let her know of [name]'s recommendations. Also spoke to patient but patient did not want to go to emergency room. Provider recommends seeing a surgeon. Webb Silversmith will place a referral for patient. Patient was made aware of this.

## 2018-07-03 NOTE — Telephone Encounter (Signed)
Please refer to telephone note as copied below. Dated 07/03/2018 at 9:29 am.      Spoke to Moore and let her know of Dinah's recommendations. Also spoke to patient but patient did not want to go to emergency room. Provider recommends seeing a surgeon. Webb Silversmith will place a referral for patient. Patient was made aware of this.

## 2018-07-03 NOTE — Telephone Encounter (Signed)
Pt noticed today 07/03/18 that hernia is harder & larger. Only goes down when lying. Has no pain in area.  Please call Thanks, Vilinda Blanks

## 2018-07-03 NOTE — Addendum Note (Signed)
Addended byMarlowe Sax C on: 07/03/2018 02:04 PM   Modules accepted: Orders

## 2018-07-03 NOTE — Telephone Encounter (Signed)
Patient need to go to ED if unable to reduce hernia as soon as possible.

## 2018-07-04 LAB — CBC WITH DIFFERENTIAL/PLATELET
Absolute Monocytes: 681 cells/uL (ref 200–950)
Basophils Absolute: 52 cells/uL (ref 0–200)
Basophils Relative: 0.7 %
Eosinophils Absolute: 178 cells/uL (ref 15–500)
Eosinophils Relative: 2.4 %
HCT: 37.6 % — ABNORMAL LOW (ref 38.5–50.0)
Hemoglobin: 12.5 g/dL — ABNORMAL LOW (ref 13.2–17.1)
Lymphs Abs: 1709 cells/uL (ref 850–3900)
MCH: 29.5 pg (ref 27.0–33.0)
MCHC: 33.2 g/dL (ref 32.0–36.0)
MCV: 88.7 fL (ref 80.0–100.0)
MPV: 10.8 fL (ref 7.5–12.5)
Monocytes Relative: 9.2 %
Neutro Abs: 4780 cells/uL (ref 1500–7800)
Neutrophils Relative %: 64.6 %
Platelets: 235 10*3/uL (ref 140–400)
RBC: 4.24 10*6/uL (ref 4.20–5.80)
RDW: 13.2 % (ref 11.0–15.0)
Total Lymphocyte: 23.1 %
WBC: 7.4 10*3/uL (ref 3.8–10.8)

## 2018-07-04 LAB — COMPLETE METABOLIC PANEL WITH GFR
AG Ratio: 1 (calc) (ref 1.0–2.5)
ALT: 9 U/L (ref 9–46)
AST: 14 U/L (ref 10–35)
Albumin: 3.7 g/dL (ref 3.6–5.1)
Alkaline phosphatase (APISO): 69 U/L (ref 35–144)
BUN: 21 mg/dL (ref 7–25)
CO2: 26 mmol/L (ref 20–32)
Calcium: 8.8 mg/dL (ref 8.6–10.3)
Chloride: 104 mmol/L (ref 98–110)
Creat: 0.7 mg/dL (ref 0.70–1.11)
GFR, Est African American: 100 mL/min/{1.73_m2} (ref >=60)
GFR, Est Non African American: 87 mL/min/{1.73_m2} (ref >=60)
Globulin: 3.6 g/dL (calc) (ref 1.9–3.7)
Glucose, Bld: 97 mg/dL (ref 65–99)
Potassium: 4.2 mmol/L (ref 3.5–5.3)
Sodium: 137 mmol/L (ref 135–146)
Total Bilirubin: 0.4 mg/dL (ref 0.2–1.2)
Total Protein: 7.3 g/dL (ref 6.1–8.1)

## 2018-07-06 NOTE — Progress Notes (Signed)
Subjective:  [name] presents to clinic today with cc of  painful, thick, discolored, elongated toenails 1-5 b/l that become tender and cannot cut because of thickness. Pain is aggravated when wearing enclosed shoe gear.  Ngetich, Nelda Bucks, NP is his PCP.    Current Outpatient Medications:  .  ibuprofen (ADVIL,MOTRIN) 200 MG tablet, Take 200 mg by mouth as needed. Reported on 04/28/2015, Disp: , Rfl:    No Known Allergies   Objective: Vitals:   07/02/18 1316  Temp: 98.6 F (37 C)    Physical Examination:  Vascular Examination: Capillary refill time immediate x 10 digits.  Palpable DP/PT pulses b/l.  Digital hair absent b/l.  No edema noted b/l.  Skin temperature gradient WNL b/l.  Dermatological Examination: Skin with normal turgor, texture and tone b/l.  No open wounds b/l.  No interdigital macerations noted b/l.  Elongated, thick, discolored brittle toenails with subungual debris and pain on dorsal palpation of nailbeds 1-5 b/l.  Musculoskeletal Examination: Muscle strength 5/5 to all muscle groups b/l.  Lateral deviation of lesser digits 2-5 b/l consistent with RA.  Neurological Examination: Sensation intact 5/5 b/l with 10 gram monofilament.  Vibratory sensation intact b/l.  Assessment: Mycotic nail infection with pain 1-5 b/l  Plan: 1. Toenails 1-5 b/l were debrided in length and girth without iatrogenic laceration. 2.  Continue soft, supportive shoe gear daily. 3.  Report any pedal injuries to medical professional. 4.  Follow up 3 months. 5.  Patient/POA to call should there be a question/concern in there interim.

## 2018-07-11 ENCOUNTER — Ambulatory Visit: Payer: Self-pay | Admitting: Surgery

## 2018-07-11 NOTE — H&P (Signed)
General Surgery [facility]  Corinda Gubler Documented: 07/11/2018 2:36 PM Location: [facility] Patient #: [id number] DOB: [birth year] Single / Language: Shon Hough / Race: White Male   History of Present Illness Velora Heckler MD; 07/11/2018 2:58 PM) The patient is a 85 year old male who presents with an inguinal hernia.  CHIEF COMPLAINT: left inguinal hernia  Patient is referred by Dr. Bufford Spikes for surgical evaluation and management of left inguinal hernia. Patient states that he has had a left inguinal hernia for probably 20 years. It has gradually increased in size. It is always been reducible. Recently it has become larger and more firm. It is becoming more difficult to reduce. He denies any changes in his bowel habits. He has had no prior hernia repair. Patient did have previous abdominal surgery related to complicated perforated appendicitis approximately 40 years ago. Patient now presents for surgical evaluation for left inguinal hernia repair.   Past Surgical History Santiago Glad, CMA; 07/11/2018 2:36 PM) Appendectomy  Foot Surgery  Bilateral. Hip Surgery  Left. Oral Surgery  Shoulder Surgery  Bilateral.  Diagnostic Studies History Santiago Glad, [place]; 07/11/2018 2:36 PM) Colonoscopy  5-10 years ago  Allergies Santiago Glad, CMA; 07/11/2018 2:37 PM) No Known Drug Allergies  [07/11/2018]: Allergies Reconciled   Medication History Santiago Glad, [place]; 07/11/2018 2:37 PM) Medications Reconciled  Social History Santiago Glad, [place]; 07/11/2018 2:36 PM) Alcohol use  Occasional alcohol use. Caffeine use  Coffee. No drug use  Tobacco use  Former smoker.  Family History Santiago Glad, [place]; 07/11/2018 2:36 PM) Family history unknown  First Degree Relatives   Other Problems Santiago Glad, [place]; 07/11/2018 2:36 PM) Arthritis  Other disease, cancer, significant illness     Review of Systems  Santiago Glad CMA; 07/11/2018 2:36 PM) General Not Present- Appetite Loss, Chills, Fatigue, Fever, Night Sweats, Weight Gain and Weight Loss. Skin Not Present- Change in Wart/Mole, Dryness, Hives, Jaundice, New Lesions, Non-Healing Wounds, Rash and Ulcer. HEENT Present- Hearing Loss and Seasonal Allergies. Not Present- Earache, Hoarseness, Nose Bleed, Oral Ulcers, Ringing in the Ears, Sinus Pain, Sore Throat, Visual Disturbances, Wears glasses/contact lenses and Yellow Eyes. Respiratory Not Present- Bloody sputum, Chronic Cough, Difficulty Breathing, Snoring and Wheezing. Male Genitourinary Not Present- Blood in Urine, Change in Urinary Stream, Frequency, Impotence, Nocturia, Painful Urination, Urgency and Urine Leakage. Musculoskeletal Present- Joint Pain and Joint Stiffness. Not Present- Back Pain, Muscle Pain, Muscle Weakness and Swelling of Extremities. Neurological Present- Decreased Memory and Trouble walking. Not Present- Fainting, Headaches, Numbness, Seizures, Tingling, Tremor and Weakness.  Vitals Santiago Glad CMA; 07/11/2018 2:37 PM) 07/11/2018 2:37 PM Weight: 120.4 lb Height: 67in Body Surface Area: 1.63 m Body Mass Index: 18.86 kg/m  Temp.: 98.38F  Pulse: 78 (Regular)  BP: 104/78(Sitting, Left Arm, Standard)       Physical Exam Velora Heckler MD; 07/11/2018 2:59 PM) The physical exam findings are as follows: Note: See vital signs recorded above  GENERAL APPEARANCE Development: normal Nutritional status: normal Gross deformities: none  SKIN Rash, lesions, ulcers: none Induration, erythema: none Nodules: none palpable  EYES Conjunctiva and lids: normal Pupils: equal and reactive Iris: normal bilaterally  EARS, NOSE, MOUTH, THROAT External ears: no lesion or deformity External nose: no lesion or deformity Hearing: grossly normal Lips: no lesion or deformity Dentition: normal for age Oral mucosa: moist  NECK Symmetric: yes Trachea:  midline Thyroid: no palpable nodules in the thyroid bed  CHEST Respiratory effort: normal Retraction or accessory muscle use:  no Breath sounds: normal bilaterally Rales, rhonchi, wheeze: none  CARDIOVASCULAR Auscultation: regular rhythm, normal rate Murmurs: none Pulses: carotid and radial pulse 2+ palpable Lower extremity edema: none Lower extremity varicosities: none  ABDOMEN Distension: none Masses: none palpable Tenderness: none Hepatosplenomegaly: not present Hernia: not present Well-healed lower midline surgical incision.  GENITOURINARY Penis: no lesions Scrotum: no masses Palpation in the right inguinal canal with cough and Valsalva shows no sign of hernia. On the left there is an obvious bulge in the left groin. On palpation there is a moderate sized direct inguinal hernia. This augments with coughing and Valsalva. It is reducible. It spontaneously prolapses.  MUSCULOSKELETAL Station and gait: normal Digits and nails: no clubbing or cyanosis Muscle strength: grossly normal all extremities Range of motion: grossly normal all extremities Deformity: Changes consistent with rheumatoid arthritis  LYMPHATIC Cervical: none palpable Supraclavicular: none palpable  PSYCHIATRIC Oriented to person, place, and time: yes Mood and affect: normal for situation Judgment and insight: appropriate for situation    Assessment & Plan [name] MD; 07/11/2018 3:01 PM)  INGUINAL HERNIA OF LEFT SIDE WITHOUT OBSTRUCTION OR GANGRENE (K40.90)  Current Plans Pt Education - [name] - Hernia Surgery: discussed with patient and provided information. Patient presents on referral from his primary care provider for evaluation of left inguinal hernia. He is provided with written literature and hernia surgery to review at home.  On exam, the patient has a moderate sized left inguinal hernia. It is reducible. It spontaneously prolapses. Patient states that this has become  larger and more firm. He has not had any change in his bowel habits.  I recommended open left inguinal hernia repair with mesh patch. We have discussed the surgical procedure. We've discussed the use of prosthetic mesh. We have discussed the restrictions on his physical activities after surgery. This would be an outpatient surgical procedure. Patient understands and wishes to proceed with surgery in the near future.  The risks and benefits of the procedure have been discussed at length with the patient. The patient understands the proposed procedure, potential alternative treatments, and the course of recovery to be expected. All of the patient's questions have been answered at this time. The patient wishes to proceed with surgery.   Armandina Gemma, [place] [facility] Office: [phone]

## 2018-07-11 NOTE — H&P (View-Only) (Signed)
General Surgery - [facility]  [name] Documented: 07/11/2018 2:36 PM Location: [facility] Patient #: [id number] DOB: 09/08/1932 Single / Language: Undefined / Race: White Male   History of Present Illness ([name] Bogosian M. Oluwatimilehin Balfour MD; 07/11/2018 2:58 PM) The patient is a 86 year old male who presents with an inguinal hernia.  CHIEF COMPLAINT: left inguinal hernia  Patient is referred by Dr. Tiffany Mccann for surgical evaluation and management of left inguinal hernia. Patient states that he has had a left inguinal hernia for probably 20 years. It has gradually increased in size. It is always been reducible. Recently it has become larger and more firm. It is becoming more difficult to reduce. He denies any changes in his bowel habits. He has had no prior hernia repair. Patient did have previous abdominal surgery related to complicated perforated appendicitis approximately 40 years ago. Patient now presents for surgical evaluation for left inguinal hernia repair.   Past Surgical History ([name], CMA; 07/11/2018 2:36 PM) Appendectomy  Foot Surgery  Bilateral. Hip Surgery  Left. Oral Surgery  Shoulder Surgery  Bilateral.  Diagnostic Studies History ([name], CMA; 07/11/2018 2:36 PM) Colonoscopy  5-10 years ago  Allergies ([name], CMA; 07/11/2018 2:37 PM) No Known Drug Allergies  [07/11/2018]: Allergies Reconciled   Medication History ([name], CMA; 07/11/2018 2:37 PM) Medications Reconciled  Social History ([name], CMA; 07/11/2018 2:36 PM) Alcohol use  Occasional alcohol use. Caffeine use  Coffee. No drug use  Tobacco use  Former smoker.  Family History ([name], CMA; 07/11/2018 2:36 PM) Family history unknown  First Degree Relatives   Other Problems ([name], CMA; 07/11/2018 2:36 PM) Arthritis  Other disease, cancer, significant illness     Review of Systems  ([name] CMA; 07/11/2018 2:36 PM) General Not Present- Appetite Loss, Chills, Fatigue, Fever, Night Sweats, Weight Gain and Weight Loss. Skin Not Present- Change in Wart/Mole, Dryness, Hives, Jaundice, New Lesions, Non-Healing Wounds, Rash and Ulcer. HEENT Present- Hearing Loss and Seasonal Allergies. Not Present- Earache, Hoarseness, Nose Bleed, Oral Ulcers, Ringing in the Ears, Sinus Pain, Sore Throat, Visual Disturbances, Wears glasses/contact lenses and Yellow Eyes. Respiratory Not Present- Bloody sputum, Chronic Cough, Difficulty Breathing, Snoring and Wheezing. Male Genitourinary Not Present- Blood in Urine, Change in Urinary Stream, Frequency, Impotence, Nocturia, Painful Urination, Urgency and Urine Leakage. Musculoskeletal Present- Joint Pain and Joint Stiffness. Not Present- Back Pain, Muscle Pain, Muscle Weakness and Swelling of Extremities. Neurological Present- Decreased Memory and Trouble walking. Not Present- Fainting, Headaches, Numbness, Seizures, Tingling, Tremor and Weakness.  Vitals ([name] CMA; 07/11/2018 2:37 PM) 07/11/2018 2:37 PM Weight: 120.4 lb Height: 67in Body Surface Area: 1.63 m Body Mass Index: 18.86 kg/m  Temp.: 98.3F  Pulse: 78 (Regular)  BP: 104/78(Sitting, Left Arm, Standard)       Physical Exam ([name] Dell M. Huma Imhoff MD; 07/11/2018 2:59 PM) The physical exam findings are as follows: Note: See vital signs recorded above  GENERAL APPEARANCE Development: normal Nutritional status: normal Gross deformities: none  SKIN Rash, lesions, ulcers: none Induration, erythema: none Nodules: none palpable  EYES Conjunctiva and lids: normal Pupils: equal and reactive Iris: normal bilaterally  EARS, NOSE, MOUTH, THROAT External ears: no lesion or deformity External nose: no lesion or deformity Hearing: grossly normal Lips: no lesion or deformity Dentition: normal for age Oral mucosa: moist  NECK Symmetric: yes Trachea:  midline Thyroid: no palpable nodules in the thyroid bed  CHEST Respiratory effort: normal Retraction or accessory muscle use:   no Breath sounds: normal bilaterally Rales, rhonchi, wheeze: none  CARDIOVASCULAR Auscultation: regular rhythm, normal rate Murmurs: none Pulses: carotid and radial pulse 2+ palpable Lower extremity edema: none Lower extremity varicosities: none  ABDOMEN Distension: none Masses: none palpable Tenderness: none Hepatosplenomegaly: not present Hernia: not present Well-healed lower midline surgical incision.  GENITOURINARY Penis: no lesions Scrotum: no masses Palpation in the right inguinal canal with cough and Valsalva shows no sign of hernia. On the left there is an obvious bulge in the left groin. On palpation there is a moderate sized direct inguinal hernia. This augments with coughing and Valsalva. It is reducible. It spontaneously prolapses.  MUSCULOSKELETAL Station and gait: normal Digits and nails: no clubbing or cyanosis Muscle strength: grossly normal all extremities Range of motion: grossly normal all extremities Deformity: Changes consistent with rheumatoid arthritis  LYMPHATIC Cervical: none palpable Supraclavicular: none palpable  PSYCHIATRIC Oriented to person, place, and time: yes Mood and affect: normal for situation Judgment and insight: appropriate for situation    Assessment & Plan [name] Regal MD; 07/11/2018 3:01 PM)  INGUINAL HERNIA OF LEFT SIDE WITHOUT OBSTRUCTION OR GANGRENE (K40.90)  Current Plans Pt Education - [name] - Hernia Surgery: discussed with patient and provided information. Patient presents on referral from his primary care provider for evaluation of left inguinal hernia. He is provided with written literature and hernia surgery to review at home.  On exam, the patient has a moderate sized left inguinal hernia. It is reducible. It spontaneously prolapses. Patient states that this has become  larger and more firm. He has not had any change in his bowel habits.  I recommended open left inguinal hernia repair with mesh patch. We have discussed the surgical procedure. We've discussed the use of prosthetic mesh. We have discussed the restrictions on his physical activities after surgery. This would be an outpatient surgical procedure. Patient understands and wishes to proceed with surgery in the near future.  The risks and benefits of the procedure have been discussed at length with the patient. The patient understands the proposed procedure, potential alternative treatments, and the course of recovery to be expected. All of the patient's questions have been answered at this time. The patient wishes to proceed with surgery.   Armandina Gemma, [place] [facility] Office: [phone]

## 2018-07-31 ENCOUNTER — Other Ambulatory Visit (HOSPITAL_COMMUNITY)
Admission: RE | Admit: 2018-07-31 | Discharge: 2018-07-31 | Disposition: A | Discharge status: Home / Self Care | Payer: Federal, State, Local not specified - PPO | Source: Ambulatory Visit | Attending: Surgery | Admitting: Surgery

## 2018-07-31 DIAGNOSIS — Z20828 Contact with and (suspected) exposure to other viral communicable diseases: Secondary | ICD-10-CM | POA: Diagnosis not present

## 2018-07-31 LAB — SARS CORONAVIRUS 2 (TAT 6-24 HRS): SARS Coronavirus 2: NEGATIVE

## 2018-08-01 ENCOUNTER — Other Ambulatory Visit (HOSPITAL_COMMUNITY): Payer: Federal, State, Local not specified - PPO

## 2018-08-01 NOTE — Progress Notes (Signed)
Late entry: Same Day pt for 08/04/18. Voice message left on 07/31/18 @ 2:30PM for a return call.  Voice message indicated that I had reached "Mateo Flow".  2nd message left on 08/01/18 @ 8:15 AM for a return call. Phone recording message indicated that I had reached [place].  Contacted Dr. Gala Lewandowsky office at 8:30 AM to advise that we have been unable to contact patient via phone. Spoke to [place], Triage Nurse to verify contact number. Abigail Butts also noted that their office has had difficulty contacting patient as well, and that phone message indicated that it was for a Mateo Flow. Abigail Butts wanted to know if we are unable to reach pt, and if pt comes in the day of surgery, can surgery be done. Spoke to Santiago Glad, Therapist, sports in short stay. Per Santiago Glad, there are protocols in place for labs and EKG. However, if labs and test are abnormal, there is a high risk for cancellation.   Abigail Butts advised of the above, and indicated that she would communicate this with Dr. Harlow Asa, and attempt to contact patient as well... Received a call from [place], who was calling on [facility] behalf. Their office have also been unable to contact Mr. Rames today as well. They have left  a message for Dr. Harlow Asa, who will make a determination if surgery will  proceed or be cancelled

## 2018-08-02 ENCOUNTER — Encounter (HOSPITAL_COMMUNITY): Payer: Self-pay | Admitting: Surgery

## 2018-08-02 DIAGNOSIS — K4090 Unilateral inguinal hernia, without obstruction or gangrene, not specified as recurrent: Secondary | ICD-10-CM

## 2018-08-04 ENCOUNTER — Telehealth (HOSPITAL_COMMUNITY): Payer: Self-pay | Admitting: *Deleted

## 2018-08-04 ENCOUNTER — Encounter (HOSPITAL_COMMUNITY)
Admission: RE | Disposition: A | Discharge status: Home / Self Care | Payer: Self-pay | Source: Home / Self Care | Attending: Surgery

## 2018-08-04 ENCOUNTER — Other Ambulatory Visit: Payer: Self-pay

## 2018-08-04 ENCOUNTER — Ambulatory Visit (HOSPITAL_COMMUNITY): Payer: Federal, State, Local not specified - PPO | Admitting: Physician Assistant

## 2018-08-04 ENCOUNTER — Ambulatory Visit (HOSPITAL_COMMUNITY): Payer: Federal, State, Local not specified - PPO

## 2018-08-04 ENCOUNTER — Encounter (HOSPITAL_COMMUNITY): Payer: Self-pay | Admitting: Registered Nurse

## 2018-08-04 ENCOUNTER — Ambulatory Visit (HOSPITAL_COMMUNITY)
Admission: RE | Admit: 2018-08-04 | Discharge: 2018-08-04 | Disposition: A | Discharge status: Home / Self Care | Payer: Federal, State, Local not specified - PPO | Attending: Surgery | Admitting: Surgery

## 2018-08-04 DIAGNOSIS — M1990 Unspecified osteoarthritis, unspecified site: Secondary | ICD-10-CM | POA: Insufficient documentation

## 2018-08-04 DIAGNOSIS — Z419 Encounter for procedure for purposes other than remedying health state, unspecified: Secondary | ICD-10-CM

## 2018-08-04 DIAGNOSIS — Z87891 Personal history of nicotine dependence: Secondary | ICD-10-CM | POA: Insufficient documentation

## 2018-08-04 DIAGNOSIS — K4090 Unilateral inguinal hernia, without obstruction or gangrene, not specified as recurrent: Secondary | ICD-10-CM | POA: Diagnosis not present

## 2018-08-04 HISTORY — PX: INGUINAL HERNIA REPAIR: SHX194

## 2018-08-04 LAB — CBC
HCT: 43.3 % (ref 39.0–52.0)
Hemoglobin: 13.6 g/dL (ref 13.0–17.0)
MCH: 29.1 pg (ref 26.0–34.0)
MCHC: 31.4 g/dL (ref 30.0–36.0)
MCV: 92.5 fL (ref 80.0–100.0)
Platelets: 201 10*3/uL (ref 150–400)
RBC: 4.68 MIL/uL (ref 4.22–5.81)
RDW: 13.9 % (ref 11.5–15.5)
WBC: 7.7 10*3/uL (ref 4.0–10.5)
nRBC: 0 % (ref 0.0–0.2)

## 2018-08-04 LAB — BASIC METABOLIC PANEL
Anion gap: 11 (ref 5–15)
BUN: 19 mg/dL (ref 8–23)
CO2: 23 mmol/L (ref 22–32)
Calcium: 8.8 mg/dL — ABNORMAL LOW (ref 8.9–10.3)
Chloride: 106 mmol/L (ref 98–111)
Creatinine, Ser: 0.64 mg/dL (ref 0.61–1.24)
GFR calc Af Amer: >60 mL/min (ref >60)
GFR calc non Af Amer: >60 mL/min (ref >60)
Glucose, Bld: 106 mg/dL — ABNORMAL HIGH (ref 70–99)
Potassium: 4.3 mmol/L (ref 3.5–5.1)
Sodium: 140 mmol/L (ref 135–145)

## 2018-08-04 SURGERY — REPAIR, HERNIA, INGUINAL, ADULT
Anesthesia: General | Laterality: Left

## 2018-08-04 MED ORDER — ACETAMINOPHEN 160 MG/5ML PO SOLN: 325.0000 mg | ORAL | Status: DC | PRN

## 2018-08-04 MED ORDER — ACETAMINOPHEN 325 MG PO TABS: 325.0000 mg | ORAL_TABLET | ORAL | Status: DC | PRN

## 2018-08-04 MED ORDER — MEPERIDINE HCL 50 MG/ML IJ SOLN: 6.2500 mg | INTRAMUSCULAR | Status: DC | PRN

## 2018-08-04 MED ORDER — ROPIVACAINE HCL 5 MG/ML IJ SOLN
INTRAMUSCULAR | Status: DC | PRN
  Administered 2018-08-04 (×5): 5 mL via PERINEURAL
  Administered 2018-08-04: 5 mL via EPIDURAL

## 2018-08-04 MED ORDER — KETOROLAC TROMETHAMINE 15 MG/ML IJ SOLN: 15.0000 mg | Freq: Once | INTRAMUSCULAR | Status: DC

## 2018-08-04 MED ORDER — LIDOCAINE 2% (20 MG/ML) 5 ML SYRINGE
INTRAMUSCULAR | Status: DC | PRN
  Administered 2018-08-04: 40 mg via INTRAVENOUS

## 2018-08-04 MED ORDER — OXYCODONE HCL 5 MG PO TABS: 5.0000 mg | ORAL_TABLET | Freq: Once | ORAL | Status: DC | PRN

## 2018-08-04 MED ORDER — PROPOFOL 10 MG/ML IV BOLUS
INTRAVENOUS | Status: DC | PRN
  Administered 2018-08-04: 120 mg via INTRAVENOUS

## 2018-08-04 MED ORDER — BUPIVACAINE HCL (PF) 0.5 % IJ SOLN
INTRAMUSCULAR | Status: DC | PRN
  Administered 2018-08-04: 20 mL

## 2018-08-04 MED ORDER — EPHEDRINE SULFATE-NACL 50-0.9 MG/10ML-% IV SOSY
PREFILLED_SYRINGE | INTRAVENOUS | Status: DC | PRN
  Administered 2018-08-04: 10 mg via INTRAVENOUS

## 2018-08-04 MED ORDER — ONDANSETRON HCL 4 MG/2ML IJ SOLN: 4.0000 mg | Freq: Once | INTRAMUSCULAR | Status: DC | PRN

## 2018-08-04 MED ORDER — PHENYLEPHRINE HCL (PRESSORS) 10 MG/ML IV SOLN
INTRAVENOUS | Status: DC | PRN
  Administered 2018-08-04: 80 ug via INTRAVENOUS

## 2018-08-04 MED ORDER — BUPIVACAINE HCL (PF) 0.5 % IJ SOLN
INTRAMUSCULAR | Status: AC
  Filled 2018-08-04: qty 30

## 2018-08-04 MED ORDER — LIDOCAINE 2% (20 MG/ML) 5 ML SYRINGE
INTRAMUSCULAR | Status: AC
  Filled 2018-08-04: qty 5

## 2018-08-04 MED ORDER — PROPOFOL 10 MG/ML IV BOLUS
INTRAVENOUS | Status: AC
  Filled 2018-08-04: qty 20

## 2018-08-04 MED ORDER — DEXAMETHASONE SODIUM PHOSPHATE 10 MG/ML IJ SOLN
INTRAMUSCULAR | Status: DC | PRN
  Administered 2018-08-04: 5 mg via INTRAVENOUS

## 2018-08-04 MED ORDER — SUCCINYLCHOLINE CHLORIDE 200 MG/10ML IV SOSY
PREFILLED_SYRINGE | INTRAVENOUS | Status: DC | PRN
  Administered 2018-08-04: 100 mg via INTRAVENOUS

## 2018-08-04 MED ORDER — CHLORHEXIDINE GLUCONATE CLOTH 2 % EX PADS: 6.0000 | MEDICATED_PAD | Freq: Once | CUTANEOUS | Status: DC

## 2018-08-04 MED ORDER — LACTATED RINGERS IV SOLN
INTRAVENOUS | Status: DC | PRN
  Administered 2018-08-04 (×2): via INTRAVENOUS

## 2018-08-04 MED ORDER — 0.9 % SODIUM CHLORIDE (POUR BTL) OPTIME
TOPICAL | Status: DC | PRN
  Administered 2018-08-04: 1000 mL

## 2018-08-04 MED ORDER — FENTANYL CITRATE (PF) 100 MCG/2ML IJ SOLN: 25.0000 ug | INTRAMUSCULAR | Status: DC | PRN

## 2018-08-04 MED ORDER — ONDANSETRON HCL 4 MG/2ML IJ SOLN
INTRAMUSCULAR | Status: AC
  Filled 2018-08-04: qty 2

## 2018-08-04 MED ORDER — ONDANSETRON HCL 4 MG/2ML IJ SOLN
INTRAMUSCULAR | Status: DC | PRN
  Administered 2018-08-04: 4 mg via INTRAVENOUS

## 2018-08-04 MED ORDER — FENTANYL CITRATE (PF) 250 MCG/5ML IJ SOLN
INTRAMUSCULAR | Status: AC
  Filled 2018-08-04: qty 5

## 2018-08-04 MED ORDER — FENTANYL CITRATE (PF) 100 MCG/2ML IJ SOLN
INTRAMUSCULAR | Status: AC
  Administered 2018-08-04: 50 ug
  Filled 2018-08-04: qty 2

## 2018-08-04 MED ORDER — SUCCINYLCHOLINE CHLORIDE 200 MG/10ML IV SOSY
PREFILLED_SYRINGE | INTRAVENOUS | Status: AC
  Filled 2018-08-04: qty 10

## 2018-08-04 MED ORDER — TRAMADOL HCL 50 MG PO TABS: 50.0000 mg | ORAL_TABLET | Freq: Four times a day (QID) | ORAL | 0 refills | Status: DC | PRN

## 2018-08-04 MED ORDER — DEXAMETHASONE SODIUM PHOSPHATE 10 MG/ML IJ SOLN
INTRAMUSCULAR | Status: AC
  Filled 2018-08-04: qty 1

## 2018-08-04 MED ORDER — OXYCODONE HCL 5 MG/5ML PO SOLN: 5.0000 mg | Freq: Once | ORAL | Status: DC | PRN

## 2018-08-04 MED ORDER — MIDAZOLAM HCL 2 MG/2ML IJ SOLN
INTRAMUSCULAR | Status: AC
  Filled 2018-08-04: qty 2

## 2018-08-04 MED ORDER — FENTANYL CITRATE (PF) 100 MCG/2ML IJ SOLN
INTRAMUSCULAR | Status: DC | PRN
  Administered 2018-08-04 (×2): 50 ug via INTRAVENOUS

## 2018-08-04 MED ORDER — CEFAZOLIN SODIUM-DEXTROSE 2-4 GM/100ML-% IV SOLN
2.0000 g | INTRAVENOUS | Status: AC
  Administered 2018-08-04: 2 g via INTRAVENOUS
  Filled 2018-08-04: qty 100

## 2018-08-04 MED ORDER — GLYCOPYRROLATE 0.2 MG/ML IJ SOLN
INTRAMUSCULAR | Status: DC | PRN
  Administered 2018-08-04: 0.2 mg via INTRAVENOUS

## 2018-08-04 MED ORDER — FENTANYL CITRATE (PF) 100 MCG/2ML IJ SOLN
50.0000 ug | INTRAMUSCULAR | Status: DC
  Administered 2018-08-04: 50 ug via INTRAVENOUS

## 2018-08-04 SURGICAL SUPPLY — 37 items
ADH SKN CLS APL DERMABOND .7 (GAUZE/BANDAGES/DRESSINGS) ×1
APL PRP STRL LF DISP 70% ISPRP (MISCELLANEOUS) ×1
BLADE SURG 15 STRL LF DISP TIS (BLADE) ×1 IMPLANT
BLADE SURG 15 STRL SS (BLADE) ×3
CHLORAPREP W/TINT 26 (MISCELLANEOUS) ×4 IMPLANT
CLOSURE WOUND 1/2 X4 (GAUZE/BANDAGES/DRESSINGS) ×1
COVER SURGICAL LIGHT HANDLE (MISCELLANEOUS) ×3 IMPLANT
COVER WAND RF STERILE (DRAPES) ×3 IMPLANT
DECANTER SPIKE VIAL GLASS SM (MISCELLANEOUS) ×3 IMPLANT
DERMABOND ADVANCED (GAUZE/BANDAGES/DRESSINGS) ×2
DERMABOND ADVANCED .7 DNX12 (GAUZE/BANDAGES/DRESSINGS) IMPLANT
DRAIN PENROSE 18X1/2 LTX STRL (DRAIN) ×3 IMPLANT
DRAPE LAPAROTOMY TRNSV 102X78 (DRAPES) ×3 IMPLANT
ELECT PENCIL ROCKER SW 15FT (MISCELLANEOUS) ×3 IMPLANT
ELECT REM PT RETURN 15FT ADLT (MISCELLANEOUS) ×3 IMPLANT
GLOVE BIOGEL PI IND STRL 7.0 (GLOVE) ×1 IMPLANT
GLOVE BIOGEL PI INDICATOR 7.0 (GLOVE) ×2
GLOVE SURG ORTHO 8.0 STRL STRW (GLOVE) ×3 IMPLANT
GOWN STRL REUS W/TWL XL LVL3 (GOWN DISPOSABLE) ×6 IMPLANT
KIT BASIN OR (CUSTOM PROCEDURE TRAY) ×3 IMPLANT
KIT TURNOVER KIT A (KITS) ×2 IMPLANT
MESH ULTRAPRO 3X6 7.6X15CM (Mesh General) ×2 IMPLANT
NDL HYPO 25X1 1.5 SAFETY (NEEDLE) ×1 IMPLANT
NEEDLE HYPO 25X1 1.5 SAFETY (NEEDLE) ×3 IMPLANT
NS IRRIG 1000ML POUR BTL (IV SOLUTION) ×3 IMPLANT
PACK BASIC VI WITH GOWN DISP (CUSTOM PROCEDURE TRAY) ×3 IMPLANT
SPONGE LAP 4X18 RFD (DISPOSABLE) ×5 IMPLANT
STRIP CLOSURE SKIN 1/2X4 (GAUZE/BANDAGES/DRESSINGS) ×2 IMPLANT
SUT MNCRL AB 4-0 PS2 18 (SUTURE) ×3 IMPLANT
SUT NOVA NAB GS-21 0 18 T12 DT (SUTURE) ×2 IMPLANT
SUT NOVA NAB GS-22 2 0 T19 (SUTURE) ×6 IMPLANT
SUT SILK 2 0 SH (SUTURE) ×3 IMPLANT
SUT VIC AB 3-0 SH 18 (SUTURE) ×3 IMPLANT
SYR BULB IRRIGATION 50ML (SYRINGE) ×3 IMPLANT
SYR CONTROL 10ML LL (SYRINGE) ×3 IMPLANT
TOWEL OR 17X26 10 PK STRL BLUE (TOWEL DISPOSABLE) ×3 IMPLANT
YANKAUER SUCT BULB TIP 10FT TU (MISCELLANEOUS) ×3 IMPLANT

## 2018-08-04 NOTE — Anesthesia Procedure Notes (Signed)
Anesthesia Regional Block: TAP block   Pre-Anesthetic Checklist: ,, timeout performed, Correct Patient, Correct Site, Correct Laterality, Correct Procedure, Correct Position, site marked, Risks and benefits discussed,  Surgical consent,  Pre-op evaluation,  At surgeon's request and post-op pain management  Laterality: Left  Prep: chloraprep       Needles:  Injection technique: Single-shot  Needle Type: Echogenic Stimulator Needle     Needle Length: 10cm  Needle Gauge: 21   Needle insertion depth: 1 cm   Additional Needles:   Procedures:,,,, ultrasound used (permanent image in chart),,,,  Narrative:  Start time: 08/04/2018 10:03 AM End time: 08/04/2018 10:13 AM Injection made incrementally with aspirations every 5 mL.  Performed by: Personally  Anesthesiologist: Lyn Hollingshead, MD

## 2018-08-04 NOTE — Op Note (Signed)
Inguinal Hernia, Open, Procedure Note  Pre-operative Diagnosis:  Left inguinal hernia, reducible  Post-operative Diagnosis: same  Surgeon:  [name], MD, FACS  Anesthesia:  General  Preparation:  Chlora-prep  Estimated Blood Loss: minimal  Complications:  none  Indications: The patient presented with a left, reducible hernia.    Procedure Details  The patient was evaluated in the holding area. All of the patient's questions were answered and the proposed procedure was confirmed. The site of the procedure was properly marked. The patient was taken to the Operating Room, identified by name, and the procedure verified as inguinal hernia repair.  The patient was placed in the supine position and underwent induction of anesthesia. A "Time Out" was performed per routine. The lower abdomen and groin were prepped and draped in the usual aseptic fashion.  After ascertaining that an adequate level of anesthesia had been obtained, an incision was made in the groin with a #10 blade.  Dissection was carried through the subcutaneous tissues and hemostasis obtained with the electrocautery.  A Gelpi retractor was placed for exposure.  The external oblique fascia was incised in line with it's fibers and extended through the external inguinal ring.  The cord structures were dissected out of the inguinal canal and encircled with a Penrose drain.  The floor of the inguinal canal was dissected out.  There was a moderate sized direct inguinal hernia.  The sac was dissected out and reduced.  The defect was closed with interrupted 0-Novofil sutures.  The cord was explored and there was no evidence of indirect hernia sac.  The floor of the inguinal canal was reconstructed with Ethicon Ultrapro mesh cut to the appropriate dimensions.  It was secured to the pubic tubercle with a 2-0 Novafil suture and along the inguinal ligament with a running 2-0 Novafil suture.  Mesh was split to accommodate the cord structures.   The superior margin of the mesh was secured to the transversalis and internal oblique musculature with interrupted 2-0 Novafil sutures.  The tails of the mesh were overlapped lateral to the cord structures and secured to the inguinal ligament with interrupted 2-0 Novafil sutures to recreate the internal inguinal ring.  Cord structures were returned to the inguinal canal.  Local anesthetic was infiltrated throughout the field.  External oblique fascia was closed with interrupted 3-0 Vicryl sutures.  Subcutaneous tissues were closed with interrupted 3-0 Vicryl sutures.  Skin was anesthetized with local anesthetic, and the skin edges were re-approximated with a running 4-0 Monocryl suture.  Wound was washed and dried and Dermabond was applied.  Instrument, sponge, and needle counts were correct prior to closure and at the conclusion of the case.  The patient tolerated the procedure well.  The patient was awakened from anesthesia and brought to the recovery room in stable condition.  Armandina Gemma, MD [facility] Office: [phone]

## 2018-08-04 NOTE — Progress Notes (Signed)
AssistedDr. Hatchett with left, ultrasound guided, transabdominal plane block. Side rails up, monitors on throughout procedure. See vital signs in flow sheet. Tolerated Procedure well.  

## 2018-08-04 NOTE — Anesthesia Procedure Notes (Signed)
Procedure Name: Intubation Date/Time: 08/04/2018 10:37 AM Performed by: Lissa Morales, CRNA Pre-anesthesia Checklist: Patient identified, Emergency Drugs available, Suction available and Patient being monitored Patient Re-evaluated:Patient Re-evaluated prior to induction Oxygen Delivery Method: Circle system utilized Preoxygenation: Pre-oxygenation with 100% oxygen Induction Type: IV induction Laryngoscope Size: Mac and 4 Grade View: Grade II Tube type: Oral Tube size: 7.5 mm Number of attempts: 1 Airway Equipment and Method: Stylet and Oral airway Placement Confirmation: ETT inserted through vocal cords under direct vision,  positive ETCO2 and breath sounds checked- equal and bilateral Secured at: 21 cm Tube secured with: Tape Dental Injury: Teeth and Oropharynx as per pre-operative assessment

## 2018-08-04 NOTE — Interval H&P Note (Signed)
History and Physical Interval Note:  08/04/2018 9:51 AM  [name]  has presented today for surgery, with the diagnosis of LEFT INGUINAL HERNIA, REDUCIBLE.  The various methods of treatment have been discussed with the patient and family. After consideration of risks, benefits and other options for treatment, the patient has consented to    Procedure(s): OPEN LEFT INGUINAL HERNIA REPAIR WITH MESH (Left) as a surgical intervention.    The patient's history has been reviewed, patient examined, no change in status, stable for surgery.  I have reviewed the patient's chart and labs.  Questions were answered to the patient's satisfaction.    Armandina Gemma, [place] [facility] Office: [place]

## 2018-08-04 NOTE — Anesthesia Postprocedure Evaluation (Signed)
Anesthesia Post Note  Patient: [name]  Procedure(s) Performed: OPEN LEFT INGUINAL HERNIA REPAIR WITH MESH (Left )     Patient location during evaluation: Phase II Anesthesia Type: General Level of consciousness: awake Pain management: pain level controlled Vital Signs Assessment: post-procedure vital signs reviewed and stable Respiratory status: spontaneous breathing Cardiovascular status: stable Postop Assessment: no apparent nausea or vomiting Anesthetic complications: no    Last Vitals:  Vitals:   08/04/18 1200 08/04/18 1214  BP: (!) 151/90 (!) 154/72  Pulse: 90 77  Resp: (!) 30 15  Temp:    SpO2: 100% 100%    Last Pain:  Vitals:   08/04/18 1214  TempSrc:   PainSc: 0-No pain   Pain Goal: Patients Stated Pain Goal: 4 (08/04/18 0917)                 Huston Foley

## 2018-08-04 NOTE — Transfer of Care (Signed)
Immediate Anesthesia Transfer of Care Note  Patient: [name]  Procedure(s) Performed: OPEN LEFT INGUINAL HERNIA REPAIR WITH MESH (Left )  Patient Location: PACU  Anesthesia Type:General  Level of Consciousness: awake, alert , oriented and patient cooperative  Airway & Oxygen Therapy: Patient Spontanous Breathing and Patient connected to face mask oxygen  Post-op Assessment: Report given to RN and Post -op Vital signs reviewed and unstable, Anesthesiologist notified  Post vital signs: stable  Last Vitals:  Vitals Value Taken Time  BP    Temp    Pulse 89 08/04/18 1151  Resp 15 08/04/18 1151  SpO2 100 % 08/04/18 1151  Vitals shown include unvalidated device data.  Last Pain:  Vitals:   08/04/18 1004  TempSrc:   PainSc: 0-No pain      Patients Stated Pain Goal: 4 (30/86/57 8469)  Complications: No apparent anesthesia complications

## 2018-08-04 NOTE — Anesthesia Preprocedure Evaluation (Addendum)
Anesthesia Evaluation  Patient identified by MRN, date of birth, ID band Patient awake    Airway Mallampati: I       Dental  (+) Upper Dentures, Poor Dentition   Pulmonary neg pulmonary ROS,    Pulmonary exam normal breath sounds clear to auscultation       Cardiovascular negative cardio ROS Normal cardiovascular exam Rhythm:Regular Rate:Normal     Neuro/Psych negative neurological ROS  negative psych ROS   GI/Hepatic negative GI ROS, Neg liver ROS,   Endo/Other  negative endocrine ROS  Renal/GU negative Renal ROS  negative genitourinary   Musculoskeletal   Abdominal Normal abdominal exam  (+)   Peds  Hematology negative hematology ROS (+)   Anesthesia Other Findings   Reproductive/Obstetrics                             Anesthesia Physical Anesthesia Plan  ASA: II  Anesthesia Plan: General   Post-op Pain Management:  Regional for Post-op pain   Induction: Intravenous  PONV Risk Score and Plan: Ondansetron  Airway Management Planned: Oral ETT  Additional Equipment: None  Intra-op Plan:   Post-operative Plan:   Informed Consent: I have reviewed the patients History and Physical, chart, labs and discussed the procedure including the risks, benefits and alternatives for the proposed anesthesia with the patient or authorized representative who has indicated his/her understanding and acceptance.     Dental advisory given  Plan Discussed with: CRNA  Anesthesia Plan Comments:         Anesthesia Quick Evaluation

## 2018-08-05 ENCOUNTER — Encounter (HOSPITAL_COMMUNITY): Payer: Self-pay | Admitting: Surgery

## 2018-10-08 ENCOUNTER — Ambulatory Visit (INDEPENDENT_AMBULATORY_CARE_PROVIDER_SITE_OTHER): Payer: Federal, State, Local not specified - PPO | Admitting: Podiatry

## 2018-10-08 ENCOUNTER — Other Ambulatory Visit: Payer: Self-pay

## 2018-10-08 ENCOUNTER — Encounter: Payer: Self-pay | Admitting: Podiatry

## 2018-10-08 DIAGNOSIS — M79675 Pain in left toe(s): Secondary | ICD-10-CM

## 2018-10-08 DIAGNOSIS — M79674 Pain in right toe(s): Secondary | ICD-10-CM | POA: Diagnosis not present

## 2018-10-08 DIAGNOSIS — B351 Tinea unguium: Secondary | ICD-10-CM

## 2018-10-08 DIAGNOSIS — M79671 Pain in right foot: Secondary | ICD-10-CM | POA: Diagnosis not present

## 2018-10-08 DIAGNOSIS — L84 Corns and callosities: Secondary | ICD-10-CM | POA: Diagnosis not present

## 2018-10-08 DIAGNOSIS — M79672 Pain in left foot: Secondary | ICD-10-CM

## 2018-10-08 NOTE — Patient Instructions (Signed)

## 2018-10-12 NOTE — Progress Notes (Signed)
Subjective:  [name] presents to clinic today with cc of  painful, thick, discolored, elongated toenails 1-5 b/l that become tender and cannot cut because of thickness. Pain is aggravated when wearing enclosed shoe gear. [name] also c/o painful plantar calluses b/l heels which are tender when weightbearing.    Current Outpatient Medications on File Prior to Visit  Medication Sig Dispense Refill  . Ferrous Sulfate (IRON PO) Take 1 tablet by mouth daily with breakfast.    . hydroxypropyl methylcellulose / hypromellose (ISOPTO TEARS / GONIOVISC) 2.5 % ophthalmic solution Place 1 drop into both eyes 3 (three) times daily as needed (dry/irritated eyes.).    Marland Kitchen ibuprofen (ADVIL,MOTRIN) 200 MG tablet Take 400-600 mg by mouth every 8 (eight) hours as needed (pain.). Reported on 04/28/2015    . traMADol (ULTRAM) 50 MG tablet Take 1-2 tablets (50-100 mg total) by mouth every 6 (six) hours as needed. 15 tablet 0   No current facility-administered medications on file prior to visit.      No Known Allergies   Objective:  Physical Examination:  Vascular Examination: Capillary refill time immediate x 10 digits.  Palpable DP/PT pulses b/l.  No digital hair b/l.   No edema noted b/l.  Skin temperature gradient WNL b/l.  Dermatological Examination: Skin with normal turgor, texture and tone b/l.  No open wounds b/l.  Elongated, thick, discolored brittle toenails with subungual debris and pain on dorsal palpation of nailbeds 1-5 b/l.  Fat pad atrophy b/l heels.   Hyperkeratotic lesion plantar aspect central heel pad with tenderness to palpation. No edema, no erythema, no drainage, no flocculence.  Musculoskeletal Examination: Muscle strength 5/5 to all muscle groups b/l.  Fibular deviation of digits 2-5 b/l consistent with rheumatoid arthritis.  No pain, crepitus or joint discomfort with active/passive ROM.  Neurological Examination: Sensation intact 5/5 b/l with 10 gram  monofilament.  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 Toenails 1-5 b/l were debrided in length and girth without iatrogenic laceration.  2. Hyperkeratotic lesions pared b/l heels utilizing sterile scalpel blade without incident. 3. Continue soft, supportive shoe gear daily 4.  Report any pedal injuries to medical professional. 5. Follow up 3 months. 6. Patient/POA to call should there be a question/concern in there interim.

## 2018-12-31 ENCOUNTER — Ambulatory Visit: Payer: Federal, State, Local not specified - PPO | Admitting: Family

## 2019-01-07 ENCOUNTER — Encounter: Payer: Self-pay | Admitting: Family

## 2019-01-07 ENCOUNTER — Ambulatory Visit (INDEPENDENT_AMBULATORY_CARE_PROVIDER_SITE_OTHER): Payer: Federal, State, Local not specified - PPO | Admitting: Family

## 2019-01-07 ENCOUNTER — Other Ambulatory Visit: Payer: Self-pay

## 2019-01-07 VITALS — BP 134/74 | HR 83 | Temp 97.1°F | Ht 67.0 in | Wt 125.0 lb

## 2019-01-07 DIAGNOSIS — Z23 Encounter for immunization: Secondary | ICD-10-CM | POA: Diagnosis not present

## 2019-01-07 DIAGNOSIS — R1312 Dysphagia, oropharyngeal phase: Secondary | ICD-10-CM | POA: Diagnosis not present

## 2019-01-07 DIAGNOSIS — M0579 Rheumatoid arthritis with rheumatoid factor of multiple sites without organ or systems involvement: Secondary | ICD-10-CM | POA: Diagnosis not present

## 2019-01-07 DIAGNOSIS — R2681 Unsteadiness on feet: Secondary | ICD-10-CM

## 2019-01-07 NOTE — Progress Notes (Signed)
Provider: Marlowe Sax FNP-C   Kinan Safley, Nelda Bucks, NP  Patient Care Team: Tashauna Caisse, Nelda Bucks, NP as PCP - General (Family Medicine)  Extended Emergency Contact Information Primary Emergency Contact: Kloepfer,Valerie Address: [address]          [place] [address] [place] Phone: [phone] Relation: Spouse  Code Status: Full Code  Goals of care: Advanced Directive information Advanced Directives 04/28/2015  Does Patient Have a Medical Advance Directive? No  Would patient like information on creating a medical advance directive? No - patient declined information     Chief Complaint  Patient presents with  . Medical Management of Chronic Issues    6 month follow up patient would also like to know where he can go for COVID vaccine   . Quality Metric Gaps    Influenza patient would like to get today     HPI:  Pt is a 86 y.o. male seen today for 6 month follow up for medical management of chronic diseases.He denies any acute issues during the visit.He is due for influenza vaccine.He would also like to know where he can go for COVID vaccine .discussed with patient that COVID-19 is administered at the hospital for health Care workers.Next will be advance age seniors but still awaiting notification when vaccine will be available for seniors.   Anemia - on Ferrous sulfate one by mouth daily.He denies any fatigue,dizziness or weakness.latest Hgb 13.2   Rheumatoid arthritis - states no longer takes Tramadol.Takes ibuprofen 400 -600 mg tablet every 8 hrs as needed     Past Medical History:  Diagnosis Date  . Rheumatoid arthritis involving multiple joints (Standard)   . Ruptured appendix   . Ventral hernia    Past Surgical History:  Procedure Laterality Date  . APPENDECTOMY  1978   had peritonitis (ruptured), had exlap  . HAND SURGERY  2005, 2010   multiple hand joint replacements Dr. Amedeo Plenty  . INGUINAL HERNIA REPAIR Left 08/04/2018   Procedure: OPEN LEFT  INGUINAL HERNIA REPAIR WITH MESH;  Surgeon: Armandina Gemma, MD;  Location: [facility];  Service: General;  Laterality: Left;  . left hip replacement     Dr. Alvan Dame  . SHOULDER SURGERY Right 1982  . SHOULDER SURGERY Left 1992    Dr. Lorre Nick    No Known Allergies  Allergies as of 01/07/2019   No Known Allergies     Medication List       Accurate as of January 07, 2019 10:31 AM. If you have any questions, ask your nurse or doctor.        hydroxypropyl methylcellulose / hypromellose 2.5 % ophthalmic solution Commonly known as: ISOPTO TEARS / GONIOVISC Place 1 drop into both eyes 3 (three) times daily as needed (dry/irritated eyes.).   ibuprofen 200 MG tablet Commonly known as: ADVIL Take 400-600 mg by mouth every 8 (eight) hours as needed (pain.). Reported on 04/28/2015   IRON PO Take 1 tablet by mouth daily with breakfast.   traMADol 50 MG tablet Commonly known as: ULTRAM Take 1-2 tablets (50-100 mg total) by mouth every 6 (six) hours as needed.       Review of Systems  Constitutional: Negative for appetite change, chills, fatigue and fever.  HENT: Positive for hearing loss. Negative for congestion, rhinorrhea, sinus pressure, sinus pain, sneezing and sore throat.        Coughs with drinking  Wears dentures on the top  Eyes: Negative for discharge, redness, itching and visual  disturbance.  Respiratory: Negative for chest tightness, shortness of breath and wheezing.   Cardiovascular: Negative for chest pain, palpitations and leg swelling.  Gastrointestinal: Negative for abdominal distention, abdominal pain, constipation, diarrhea, nausea and vomiting.  Endocrine: Negative for cold intolerance, heat intolerance, polydipsia, polyphagia and polyuria.  Genitourinary: Negative for difficulty urinating, dysuria, flank pain and urgency.       Wakes every 3 hrs to void. Drinks a lot of coffee during the day.  Musculoskeletal: Positive for arthralgias and gait problem.       Arthritic pain on  knees and hands   Skin: Negative for color change, pallor and rash.  Neurological: Negative for dizziness, speech difficulty, weakness, light-headedness, numbness and headaches.  Hematological: Does not bruise/bleed easily.  Psychiatric/Behavioral: Negative for agitation, behavioral problems, confusion and sleep disturbance. The patient is not nervous/anxious.     Immunization History  Administered Date(s) Administered  . Influenza,inj,Quad PF,6+ Mos 10/18/2014  . Influenza-Unspecified 10/01/2013  . Pneumococcal Conjugate-13 04/15/2014  . Pneumococcal Polysaccharide-23 04/11/2015   Pertinent  Health Maintenance Due  Topic Date Due  . INFLUENZA VACCINE  08/02/2018  . PNA vac Low Risk Adult  Completed   Fall Risk  04/11/2015 10/18/2014 04/15/2014 10/15/2013 07/16/2013  Falls in the past year? Yes No No No No  Number falls in past yr: 2 or more - - - -  Injury with Fall? No - - - -  Risk for fall due to : History of fall(s);Impaired balance/gait;Impaired mobility - - - -    Vitals:   01/07/19 1016  BP: 134/74  Pulse: 83  Temp: (!) 97.1 F (36.2 C)  TempSrc: Temporal  SpO2: 97%  Weight: 125 lb (56.7 kg)  Height: _0  (1.702 m)   Body mass index is 19.58 kg/m. Physical Exam Vitals reviewed.  Constitutional:      General: He is not in acute distress.    Appearance: He is normal weight. He is not ill-appearing.  HENT:     Head: Normocephalic.     Right Ear: Tympanic membrane, ear canal and external ear normal. There is no impacted cerumen.     Left Ear: Tympanic membrane, ear canal and external ear normal. There is no impacted cerumen.     Nose: Nose normal. No congestion or rhinorrhea.     Mouth/Throat:     Mouth: Mucous membranes are moist.     Pharynx: Oropharynx is clear. No oropharyngeal exudate or posterior oropharyngeal erythema.  Eyes:     General: No scleral icterus.       Right eye: No discharge.        Left eye: No discharge.     Extraocular Movements:  Extraocular movements intact.     Conjunctiva/sclera: Conjunctivae normal.     Pupils: Pupils are equal, round, and reactive to light.  Neck:     Vascular: No carotid bruit.     Comments: Limited ROM Unable to look over the shoulder  Cardiovascular:     Rate and Rhythm: Normal rate and regular rhythm.     Pulses: Normal pulses.     Heart sounds: Normal heart sounds. No murmur. No friction rub. No gallop.   Pulmonary:     Effort: Pulmonary effort is normal. No respiratory distress.     Breath sounds: Normal breath sounds. No wheezing, rhonchi or rales.  Chest:     Chest wall: No tenderness.  Abdominal:     General: Bowel sounds are normal. There is no distension.  Palpations: Abdomen is soft. There is no mass.     Tenderness: There is no abdominal tenderness. There is no right CVA tenderness, left CVA tenderness, guarding or rebound.  Musculoskeletal:        General: No tenderness.     Cervical back: No rigidity or tenderness.     Right lower leg: No edema.     Left lower leg: No edema.     Comments: Unsteady gait.Arthritic changes to fingers.  Lymphadenopathy:     Cervical: No cervical adenopathy.  Skin:    General: Skin is warm.     Coloration: Skin is not pale.     Findings: No bruising, erythema or rash.  Neurological:     Mental Status: He is alert and oriented to person, place, and time.     Cranial Nerves: No cranial nerve deficit.     Sensory: No sensory deficit.     Motor: No weakness.     Coordination: Coordination normal.     Gait: Gait abnormal.  Psychiatric:        Mood and Affect: Mood normal.        Behavior: Behavior normal.        Thought Content: Thought content normal.        Judgment: Judgment normal.    Labs reviewed: Recent Labs    07/03/18 0906 08/04/18 0905  NA 137 140  K 4.2 4.3  CL 104 106  CO2 26 23  GLUCOSE 97 106*  BUN 21 19  CREATININE 0.70 0.64  CALCIUM 8.8 8.8*   Recent Labs    07/03/18 0906  AST 14  ALT 9  BILITOT 0.4    PROT 7.3   Recent Labs    07/03/18 0906 08/04/18 0905  WBC 7.4 7.7  NEUTROABS 4,780  --   HGB 12.5* 13.6  HCT 37.6* 43.3  MCV 88.7 92.5  PLT 235 201   Lab Results  Component Value Date   TSH 4.420 07/16/2013   Lab Results  Component Value Date   HGBA1C 5.7 (H) 04/11/2015   Lab Results  Component Value Date   CHOL 104 04/11/2015   HDL 36 (L) 04/11/2015   LDLCALC 54 04/11/2015   TRIG 71 04/11/2015   CHOLHDL 2.9 04/11/2015    Significant Diagnostic Results in last 30 days:  No results found.  Assessment/Plan 1. Need for influenza vaccination Afebrile.No signs of URI's. - Flu Vaccine QUAD High Dose(Fluad) administered by Ruthell Rummage CMA.   2. Rheumatoid arthritis involving multiple sites with positive rheumatoid factor (HCC) Pain controlled on ibuprofen.not taking tramadol any more.  - CBC with Differential/Platelet - BMP with eGFR([facility])  3. Unsteady gait No fall episodes.continues to uses a cane.   4. Oropharyngeal dysphagia Reports no difficulties swallowing during meals but occasional cough but has improved.  - CBC with Differential/Platelet - BMP with eGFR([facility])  Family/ staff Communication: Reviewed plan of care with patient.  Labs/tests ordered:  - CBC with Differential/Platelet - BMP with eGFR([facility])  Nelda Bucks Conley Delisle, NP

## 2019-01-08 LAB — CBC WITH DIFFERENTIAL/PLATELET
Absolute Monocytes: 639 cells/uL (ref 200–950)
Basophils Absolute: 39 cells/uL (ref 0–200)
Basophils Relative: 0.5 %
Eosinophils Absolute: 200 cells/uL (ref 15–500)
Eosinophils Relative: 2.6 %
HCT: 37.8 % — ABNORMAL LOW (ref 38.5–50.0)
Hemoglobin: 12.8 g/dL — ABNORMAL LOW (ref 13.2–17.1)
Lymphs Abs: 1609 cells/uL (ref 850–3900)
MCH: 29.4 pg (ref 27.0–33.0)
MCHC: 33.9 g/dL (ref 32.0–36.0)
MCV: 86.7 fL (ref 80.0–100.0)
MPV: 10.8 fL (ref 7.5–12.5)
Monocytes Relative: 8.3 %
Neutro Abs: 5213 cells/uL (ref 1500–7800)
Neutrophils Relative %: 67.7 %
Platelets: 214 10*3/uL (ref 140–400)
RBC: 4.36 10*6/uL (ref 4.20–5.80)
RDW: 12.9 % (ref 11.0–15.0)
Total Lymphocyte: 20.9 %
WBC: 7.7 10*3/uL (ref 3.8–10.8)

## 2019-01-08 LAB — BASIC METABOLIC PANEL WITH GFR
BUN: 22 mg/dL (ref 7–25)
CO2: 27 mmol/L (ref 20–32)
Calcium: 9.1 mg/dL (ref 8.6–10.3)
Chloride: 102 mmol/L (ref 98–110)
Creat: 0.75 mg/dL (ref 0.70–1.11)
GFR, Est African American: 98 mL/min/{1.73_m2} (ref >=60)
GFR, Est Non African American: 84 mL/min/{1.73_m2} (ref >=60)
Glucose, Bld: 97 mg/dL (ref 65–99)
Potassium: 4.3 mmol/L (ref 3.5–5.3)
Sodium: 137 mmol/L (ref 135–146)

## 2019-01-14 ENCOUNTER — Encounter: Payer: Self-pay | Admitting: Podiatry

## 2019-01-14 ENCOUNTER — Ambulatory Visit (INDEPENDENT_AMBULATORY_CARE_PROVIDER_SITE_OTHER): Payer: Federal, State, Local not specified - PPO | Admitting: Podiatry

## 2019-01-14 ENCOUNTER — Other Ambulatory Visit: Payer: Self-pay

## 2019-01-14 DIAGNOSIS — M79674 Pain in right toe(s): Secondary | ICD-10-CM | POA: Diagnosis not present

## 2019-01-14 DIAGNOSIS — M79675 Pain in left toe(s): Secondary | ICD-10-CM | POA: Diagnosis not present

## 2019-01-14 DIAGNOSIS — B351 Tinea unguium: Secondary | ICD-10-CM | POA: Diagnosis not present

## 2019-01-14 NOTE — Patient Instructions (Signed)

## 2019-01-21 NOTE — Progress Notes (Signed)
Subjective:  ANANDA SITZER presents to clinic today with cc of  painful, thick, discolored, elongated toenails  of both feet that become tender and patient cannot cut because of thickness. Pain is aggravated when wearing enclosed shoe gear and relieved with periodic professional debridement.  He also has history of bilateral plantar calluses of both heels.  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  Patient voices no new pedal concerns on today's visit.  Medications reviewed in chart.  No Known Allergies   Objective:  Physical Examination:  Vascular Examination: Capillary refill time immediate b/l.  Palpable DP/PT pulses b/l.  Digital hair absent b/l.   No edema noted b/l.  Skin temperature gradient WNL b/l.  Dermatological Examination: Skin with normal turgor, texture and tone b/l.  No open wounds b/l.  Fat pad atrophy bilateral heel pads.  No plantar callosities of heel pads noted today.  Elongated, thick, discolored brittle toenails with subungual debris and pain on dorsal palpation of nailbeds 1-5 b/l.  Musculoskeletal Examination: Muscle strength 5/5 to all muscle groups b/l.  Fibular deviation of digits 2 through 5 bilaterally.  No pain, crepitus or joint discomfort with active/passive ROM.  Neurological Examination: Sensation intact 5/5 b/l with 10 gram monofilament.  Assessment: Mycotic nail infection with pain 1-5 b/l  Plan: 1. Toenails 1-5 b/l were debrided in length and girth without iatrogenic laceration.   2. Dispensed felt heel pads for his shoes. Continue soft, supportive shoe gear daily. 3.  Report any pedal injuries to medical professional. 4.  Follow up 3 months. 5.  Patient/POA to call should there be a question/concern in there interim.

## 2019-02-12 ENCOUNTER — Ambulatory Visit: Payer: Federal, State, Local not specified - PPO | Attending: Internal Medicine

## 2019-02-12 DIAGNOSIS — Z23 Encounter for immunization: Secondary | ICD-10-CM

## 2019-02-12 NOTE — Progress Notes (Signed)
   Covid-19 Vaccination Clinic  Name:  [name]    MRN: [id number] DOB: 12-09-1932  02/12/2019  [name] was observed post Covid-19 immunization for 15 minutes without incidence. He was provided with Vaccine Information Sheet and instruction to access the V-Safe system.   [name] was instructed to call 911 with any severe reactions post vaccine: Marland Kitchen Difficulty breathing  . Swelling of your face and throat  . A fast heartbeat  . A bad rash all over your body  . Dizziness and weakness    Immunizations Administered    Name Date Dose VIS Date Route   Pfizer COVID-19 Vaccine 02/12/2019  3:13 PM 0.3 mL 12/12/2018 Intramuscular   Manufacturer: [employer], [employer]   Lot: [id number]   NDC: [address]-1

## 2019-03-07 ENCOUNTER — Ambulatory Visit: Payer: Federal, State, Local not specified - PPO | Attending: Internal Medicine

## 2019-03-07 DIAGNOSIS — Z23 Encounter for immunization: Secondary | ICD-10-CM

## 2019-03-07 NOTE — Progress Notes (Signed)
   Covid-19 Vaccination Clinic  Name:  ABDULAH IQBAL    MRN: [id number] DOB: Dec 04, 1932  03/07/2019  [name] was observed post Covid-19 immunization for 15 minutes without incident. He was provided with Vaccine Information Sheet and instruction to access the V-Safe system.   [name] was instructed to call 911 with any severe reactions post vaccine: Marland Kitchen Difficulty breathing  . Swelling of face and throat  . A fast heartbeat  . A bad rash all over body  . Dizziness and weakness   Immunizations Administered    Name Date Dose VIS Date Route   Pfizer COVID-19 Vaccine 03/07/2019  1:51 PM 0.3 mL 12/12/2018 Intramuscular   Manufacturer: [employer], [employer]   Lot: [id number]   NDC: [address]-8

## 2019-04-21 ENCOUNTER — Ambulatory Visit: Payer: Federal, State, Local not specified - PPO | Admitting: Podiatry

## 2019-05-05 ENCOUNTER — Other Ambulatory Visit: Payer: Self-pay

## 2019-05-05 ENCOUNTER — Ambulatory Visit (INDEPENDENT_AMBULATORY_CARE_PROVIDER_SITE_OTHER): Payer: Federal, State, Local not specified - PPO | Admitting: Family

## 2019-05-05 ENCOUNTER — Encounter: Payer: Self-pay | Admitting: Family

## 2019-05-05 VITALS — BP 118/72 | HR 70 | Temp 97.5°F | Ht 67.0 in | Wt 120.0 lb

## 2019-05-05 DIAGNOSIS — R413 Other amnesia: Secondary | ICD-10-CM

## 2019-05-05 DIAGNOSIS — R410 Disorientation, unspecified: Secondary | ICD-10-CM

## 2019-05-05 LAB — POCT URINALYSIS DIPSTICK
Bilirubin, UA: NEGATIVE
Blood, UA: NEGATIVE
Glucose, UA: NEGATIVE
Ketones, UA: NEGATIVE
Leukocytes, UA: NEGATIVE
Nitrite, UA: NEGATIVE
Protein, UA: POSITIVE — AB
Spec Grav, UA: 1.02 (ref 1.010–1.025)
Urobilinogen, UA: NEGATIVE E.U./dL — AB
pH, UA: 5 (ref 5.0–8.0)

## 2019-05-05 NOTE — Progress Notes (Signed)
Provider: Marlowe Sax FNP-C  Jamall Strohmeier, Nelda Bucks, NP  Patient Care Team: Maxey Ransom, Nelda Bucks, NP as PCP - General (Family Medicine)  Extended Emergency Contact Information Primary Emergency Contact: Borah,Valerie Address: [address]          [place] [address] [place] Phone: [phone] Relation: Spouse  Code Status: Full Code  Goals of care: Advanced Directive information Advanced Directives 05/05/2019  Does Patient Have a Medical Advance Directive? No  Would patient like information on creating a medical advance directive? Yes (ED - Information included in AVS)     Chief Complaint  Patient presents with   Acute Visit    Want  to be tested for dementia and get meds.     HPI:  Pt is a 86 y.o. male seen today for an acute visit for evaluation of worsening memory loss.He is here with wife.He would like to be tested for dementia and get meds.Wife states patient failed to file his income taxes he is unable to remember how to do things.For instance,wife can tell him to put cup down.He will ask where to put and gets confused in the room.He does not follow directions. Has difficulties finding words and names of items in the Kitchen such as the Refrigerator  When outside on previous familiar place he does not remember where he is.He argues with wife a lot when he is told what to do. Wife assist with his activities of daily living due to chronic Rheumatoid arthritis on the fingers. He sits outside with the dog but does not try to wonder away.  He states just loosing time aware of his age " Just enjoying the left years". Eats but not that great.Has also been underweight.  Has history of drinking alcohol a lot when he was in his 20's and 30's but states he cut down drinking in his 40's-50's then quit.He had multiple sexual partners when young.Wife is second one. No History of STD's.    Past Medical History:  Diagnosis Date   Rheumatoid arthritis involving  multiple joints ([place])    Ruptured appendix    Ventral hernia    Past Surgical History:  Procedure Laterality Date   APPENDECTOMY  1978   had peritonitis (ruptured), had exlap   HAND SURGERY  2005, 2010   multiple hand joint replacements Dr. Amedeo Plenty   INGUINAL HERNIA REPAIR Left 08/04/2018   Procedure: OPEN LEFT INGUINAL HERNIA REPAIR WITH MESH;  Surgeon: Armandina Gemma, MD;  Location: [facility];  Service: General;  Laterality: Left;   left hip replacement     Dr. Alvan Dame   SHOULDER SURGERY Right 1982   SHOULDER SURGERY Left 1992    Dr. Lorre Nick    No Known Allergies  Outpatient Encounter Medications as of 05/05/2019  Medication Sig   ibuprofen (ADVIL,MOTRIN) 200 MG tablet Take 400-600 mg by mouth every 8 (eight) hours as needed (pain.). Reported on 04/28/2015   [DISCONTINUED] Ferrous Sulfate (IRON PO) Take 1 tablet by mouth daily with breakfast.   [DISCONTINUED] hydroxypropyl methylcellulose / hypromellose (ISOPTO TEARS / GONIOVISC) 2.5 % ophthalmic solution Place 1 drop into both eyes 3 (three) times daily as needed (dry/irritated eyes.).   No facility-administered encounter medications on file as of 05/05/2019.    Review of Systems  Constitutional: Negative for appetite change, chills, fatigue and fever.  HENT: Positive for hearing loss. Negative for congestion, rhinorrhea, sinus pressure, sinus pain, sneezing and sore throat.   Eyes: Positive for visual disturbance. Negative for  discharge, redness and itching.       Reading eye glasses   Respiratory: Negative for cough, chest tightness, shortness of breath and wheezing.   Cardiovascular: Negative for chest pain, palpitations and leg swelling.  Gastrointestinal: Negative for abdominal distention, abdominal pain, constipation, diarrhea, nausea and vomiting.  Endocrine: Negative for cold intolerance, heat intolerance, polydipsia, polyphagia and polyuria.  Genitourinary: Negative for difficulty urinating, flank pain, frequency and  urgency.  Musculoskeletal: Positive for arthralgias and gait problem.  Skin: Negative for color change, pallor and rash.  Neurological: Negative for dizziness, speech difficulty, weakness, light-headedness, numbness and headaches.  Psychiatric/Behavioral: Positive for confusion. Negative for agitation and sleep disturbance. The patient is not nervous/anxious.     Immunization History  Administered Date(s) Administered   Fluad Quad(high Dose 65+) 01/07/2019   Influenza,inj,Quad PF,6+ Mos 10/18/2014   Influenza-Unspecified 10/01/2013   PFIZER SARS-COV-2 Vaccination 02/12/2019, 03/07/2019   Pneumococcal Conjugate-13 04/15/2014   Pneumococcal Polysaccharide-23 04/11/2015   Pertinent  Health Maintenance Due  Topic Date Due   INFLUENZA VACCINE  08/02/2019   PNA vac Low Risk Adult  Completed   Fall Risk  05/05/2019 04/11/2015 10/18/2014 04/15/2014 10/15/2013  Falls in the past year? 1 Yes No No No  Number falls in past yr: 1 2 or more - - -  Injury with Fall? 1 No - - -  Risk for fall due to : - History of fall(s);Impaired balance/gait;Impaired mobility - - -    Vitals:   05/05/19 1306  BP: 118/72  Pulse: 70  Temp: (!) 97.5 F (36.4 C)  SpO2: 95%  Weight: 120 lb (54.4 kg)  Height: '5\' 7"'$  (1.702 m)   Body mass index is 18.79 kg/m. Physical Exam Vitals reviewed.  Constitutional:      General: He is not in acute distress.    Appearance: He is underweight. He is not ill-appearing.  HENT:     Head: Normocephalic.     Right Ear: Tympanic membrane, ear canal and external ear normal. There is no impacted cerumen.     Left Ear: Tympanic membrane, ear canal and external ear normal. There is no impacted cerumen.     Ears:     Comments: Hear of hearing     Nose: Nose normal. No congestion or rhinorrhea.     Mouth/Throat:     Pharynx: Oropharynx is clear. No oropharyngeal exudate or posterior oropharyngeal erythema.  Eyes:     General: No scleral icterus.       Right eye: No  discharge.        Left eye: No discharge.     Extraocular Movements: Extraocular movements intact.     Conjunctiva/sclera: Conjunctivae normal.     Pupils: Pupils are equal, round, and reactive to light.  Neck:     Vascular: No carotid bruit.  Cardiovascular:     Rate and Rhythm: Normal rate and regular rhythm.     Pulses: Normal pulses.     Heart sounds: Normal heart sounds. No murmur. No friction rub. No gallop.   Pulmonary:     Effort: Pulmonary effort is normal. No respiratory distress.     Breath sounds: Normal breath sounds. No wheezing, rhonchi or rales.  Chest:     Chest wall: No tenderness.  Abdominal:     General: Bowel sounds are normal. There is no distension.     Palpations: Abdomen is soft. There is no mass.     Tenderness: There is no abdominal tenderness. There is no right CVA  tenderness, left CVA tenderness, guarding or rebound.  Musculoskeletal:     Cervical back: Normal range of motion. No rigidity or tenderness.     Right lower leg: No edema.     Left lower leg: No edema.     Comments: Unsteady gait walks with a cane.Arthritis changes to fingers and right knee   Lymphadenopathy:     Cervical: No cervical adenopathy.  Skin:    General: Skin is warm and dry.     Coloration: Skin is not pale.     Findings: No bruising, erythema or rash.  Neurological:     Mental Status: He is alert.     Cranial Nerves: No cranial nerve deficit.     Sensory: No sensory deficit.     Motor: No weakness.     Gait: Gait abnormal.     Comments: Scored 21/30 on MMSE failed clock drawing   Psychiatric:        Mood and Affect: Mood normal.        Speech: Speech normal.        Behavior: Behavior is agitated.        Thought Content: Thought content normal.        Cognition and Memory: Memory is impaired. He exhibits impaired recent memory.    Labs reviewed: Recent Labs    07/03/18 0906 08/04/18 0905 01/07/19 1106  NA 137 140 137  K 4.2 4.3 4.3  CL 104 106 102  CO2 '26 23 27   '$ GLUCOSE 97 106* 97  BUN '21 19 22  '$ CREATININE 0.70 0.64 0.75  CALCIUM 8.8 8.8* 9.1   Recent Labs    07/03/18 0906  AST 14  ALT 9  BILITOT 0.4  PROT 7.3   Recent Labs    07/03/18 0906 08/04/18 0905 01/07/19 1106  WBC 7.4 7.7 7.7  NEUTROABS 4,780  --  5,213  HGB 12.5* 13.6 12.8*  HCT 37.6* 43.3 37.8*  MCV 88.7 92.5 86.7  PLT 235 201 214   Lab Results  Component Value Date   TSH 4.420 07/16/2013   Lab Results  Component Value Date   HGBA1C 5.7 (H) 04/11/2015   Lab Results  Component Value Date   CHOL 104 04/11/2015   HDL 36 (L) 04/11/2015   LDLCALC 54 04/11/2015   TRIG 71 04/11/2015   CHOLHDL 2.9 04/11/2015    Significant Diagnostic Results in last 30 days:  No results found.  Assessment/Plan 1. Memory loss Has required increased assistance with his ADL's,failed to file his taxes,difficulty in finding words," loosing time" and easily irritated.Scored 21/30 on MMSE consisted with mild cognitive impairment.will rule out other acute abnormalities.will hold on initiating Aricept he is underwent.will consider ordering MRI to rule out dementia.consider referral to Neurologist.  - CBC with Differential/Platelet - BMP with eGFR([facility]) - TSH - Vitamin B12 - RPR - Folate  2. Confusion Afebrile.suspect due to his worsening memory loss.labs as above.Will check urine to rule out urinary tract infection.  - POC Urinalysis Dipstick negative for nitrites.  Family/ staff Communication: Reviewed plan of care with patient and wife verbalized understanding.   Labs/tests ordered:  - CBC with Differential/Platelet - BMP with eGFR([facility]) - TSH - Vitamin B12 - RPR - Folate - Urine analysis  Next Appointment: as needed if symptoms worsen or fail to improve.  Sandrea Hughs, NP

## 2019-05-05 NOTE — Patient Instructions (Signed)
-Lab work done will call you with results. - will Refer to Neurologist once we get the labs back   Alzheimer Disease Caregiver Guide  Alzheimer disease causes a person to lose the ability to remember things and make decisions. A person who has Alzheimer disease may not be able to take care of himself or herself. He or she may need help with simple tasks. The tips below can help you care for the person. What kind of changes does this condition cause? This condition makes a person:  Forget things.  Feel confused.  Act differently.  Have different moods. These things get worse with time. Tips to help with symptoms  Be calm and patient.  Respond with a simple, short answer.  Avoid correcting the person in a negative way.  Try not to take things personally, even if the person forgets your name.  Do not argue with the person. This may make the person more upset. Tips to lessen frustration  Make appointments and do daily tasks when the person is at his or her best.  Take your time. Simple tasks may take longer. Allow plenty of time to complete tasks.  Limit choices for the person.  Involve the person in what you are doing.  Keep a daily routine.  Avoid new or crowded places, if possible.  Use simple words, short sentences, and a calm voice. Only give one direction at a time.  Buy clothes and shoes that are easy to put on and take off.  Organize medicines in a pillbox for each day of the week.  Keep a calendar in a central location to remind the person of meetings or other activities.  Let people help if they offer. Take a break when needed. Tips to prevent injury  Keep floors clear. Remove rugs, magazine racks, and floor lamps.  Keep hallways well-lit.  Put a handrail and non-slip mat in the bathtub or shower.  Put childproof locks on cabinets that have dangerous items in them. These items include medicine, alcohol, guns, toxic cleaning items, sharp tools, matches,  and lighters.  Put locks on doors where the person cannot see or reach them. This helps the person to not wander out of the house and get lost.  Be prepared for emergencies. Keep a list of emergency phone numbers and addresses close by.  Bracelets may be worn that track location and identify the person as having memory problems. This should be worn at all times for safety. Tips for the future  Discuss financial and legal planning early. People with this disease have trouble managing their money as the disease gets worse. Get help from a professional.  Talk about advance directives, safety, and daily care. Take these steps: ? Create a living will and choose a power of attorney. This is someone who can make decisions for the person with Alzheimer disease when he or she can no longer do so. ? Discuss driving safety and when to stop driving. The person's doctor can help with this. ? If the person lives alone, make sure he or she is safe. Some people need extra help at home. Other people need more care at a nursing home or care center. Where to find support You can find support by joining a support group near you. Some benefits of joining a support group include:  Learning ways to manage stress.  Sharing experiences with others.  Getting emotional comfort and support.  Learning about caregiving as the disease progresses.  Knowing what community  resources are available and making use of them. Where to find more information  Alzheimer[employer] Association: [employer] Contact a doctor if:  The person has a fever.  The person has a sudden behavior change that does not get better with calming strategies.  The person is not able to take care of himself or herself at home.  The person threatens you or anyone else, including himself or herself.  You are no longer able to care for the person. Summary  Alzheimer disease causes a person to forget things and to be confused.  A person who has  this condition may not be able to take care of himself or herself.  Take steps to keep the person from getting hurt. Plan for future care.  You can find support by joining a support group near you. This information is not intended to replace advice given to you by your health care provider. Make sure you discuss any questions you have with your health care provider. Document Revised: 04/08/2018 Document Reviewed: 12/13/2016 Elsevier Patient Education  2020 [employer].

## 2019-05-06 LAB — BASIC METABOLIC PANEL WITH GFR
BUN: 25 mg/dL (ref 7–25)
CO2: 25 mmol/L (ref 20–32)
Calcium: 8.9 mg/dL (ref 8.6–10.3)
Chloride: 102 mmol/L (ref 98–110)
Creat: 0.8 mg/dL (ref 0.70–1.11)
GFR, Est African American: 94 mL/min/{1.73_m2} (ref >=60)
GFR, Est Non African American: 81 mL/min/{1.73_m2} (ref >=60)
Glucose, Bld: 90 mg/dL (ref 65–99)
Potassium: 4.4 mmol/L (ref 3.5–5.3)
Sodium: 137 mmol/L (ref 135–146)

## 2019-05-06 LAB — CBC WITH DIFFERENTIAL/PLATELET
Absolute Monocytes: 591 cells/uL (ref 200–950)
Basophils Absolute: 44 cells/uL (ref 0–200)
Basophils Relative: 0.6 %
Eosinophils Absolute: 168 cells/uL (ref 15–500)
Eosinophils Relative: 2.3 %
HCT: 37.1 % — ABNORMAL LOW (ref 38.5–50.0)
Hemoglobin: 12.2 g/dL — ABNORMAL LOW (ref 13.2–17.1)
Lymphs Abs: 1533 cells/uL (ref 850–3900)
MCH: 29 pg (ref 27.0–33.0)
MCHC: 32.9 g/dL (ref 32.0–36.0)
MCV: 88.3 fL (ref 80.0–100.0)
MPV: 11 fL (ref 7.5–12.5)
Monocytes Relative: 8.1 %
Neutro Abs: 4964 cells/uL (ref 1500–7800)
Neutrophils Relative %: 68 %
Platelets: 230 10*3/uL (ref 140–400)
RBC: 4.2 10*6/uL (ref 4.20–5.80)
RDW: 13.1 % (ref 11.0–15.0)
Total Lymphocyte: 21 %
WBC: 7.3 10*3/uL (ref 3.8–10.8)

## 2019-05-06 LAB — URINALYSIS
Bilirubin Urine: NEGATIVE
Glucose, UA: NEGATIVE
Hgb urine dipstick: NEGATIVE
Ketones, ur: NEGATIVE
Leukocytes,Ua: NEGATIVE
Nitrite: NEGATIVE
Protein, ur: NEGATIVE
Specific Gravity, Urine: 1.015 (ref 1.001–1.03)
pH: <=5 (ref 5.0–8.0)

## 2019-05-06 LAB — RPR: RPR Ser Ql: NONREACTIVE

## 2019-05-06 LAB — FOLATE: Folate: 20.8 ng/mL

## 2019-05-06 LAB — TSH: TSH: 3.98 mIU/L (ref 0.40–4.50)

## 2019-05-06 LAB — VITAMIN B12: Vitamin B-12: 312 pg/mL (ref 200–1100)

## 2019-05-07 ENCOUNTER — Other Ambulatory Visit: Payer: Self-pay | Admitting: Family

## 2019-05-07 DIAGNOSIS — R413 Other amnesia: Secondary | ICD-10-CM

## 2019-05-07 DIAGNOSIS — R410 Disorientation, unspecified: Secondary | ICD-10-CM

## 2019-05-28 ENCOUNTER — Other Ambulatory Visit: Payer: Self-pay

## 2019-05-28 ENCOUNTER — Ambulatory Visit
Admission: RE | Admit: 2019-05-28 | Discharge: 2019-05-28 | Disposition: A | Discharge status: Home / Self Care | Payer: Federal, State, Local not specified - PPO | Source: Ambulatory Visit | Attending: Family | Admitting: Family

## 2019-05-28 DIAGNOSIS — R410 Disorientation, unspecified: Secondary | ICD-10-CM

## 2019-05-28 DIAGNOSIS — R413 Other amnesia: Secondary | ICD-10-CM

## 2019-05-28 MED ORDER — IOPAMIDOL (ISOVUE-300) INJECTION 61%
75.0000 mL | Freq: Once | INTRAVENOUS | Status: AC | PRN
  Administered 2019-05-28: 75 mL via INTRAVENOUS

## 2019-06-02 ENCOUNTER — Telehealth: Payer: Self-pay | Admitting: *Deleted

## 2019-06-02 NOTE — Telephone Encounter (Signed)
Patient wife, Vikki Ports called and left message on Voicemail stating that patient cannot do for himself. Stated that she has to do everything for him and she can't do it all by herself anymore and requesting Home Health.   I called and LMOM for Vikki Ports to return call.

## 2019-06-02 NOTE — Telephone Encounter (Signed)
Appointment scheduled to discuss Home Health Referral.

## 2019-06-03 ENCOUNTER — Ambulatory Visit (INDEPENDENT_AMBULATORY_CARE_PROVIDER_SITE_OTHER): Payer: Federal, State, Local not specified - PPO | Admitting: Family

## 2019-06-03 ENCOUNTER — Encounter: Payer: Self-pay | Admitting: Family

## 2019-06-03 ENCOUNTER — Other Ambulatory Visit: Payer: Self-pay

## 2019-06-03 VITALS — BP 110/68 | HR 60 | Temp 97.8°F | Resp 16 | Ht 67.0 in | Wt 119.0 lb

## 2019-06-03 DIAGNOSIS — R2681 Unsteadiness on feet: Secondary | ICD-10-CM

## 2019-06-03 DIAGNOSIS — R3981 Functional urinary incontinence: Secondary | ICD-10-CM

## 2019-06-03 DIAGNOSIS — M0579 Rheumatoid arthritis with rheumatoid factor of multiple sites without organ or systems involvement: Secondary | ICD-10-CM

## 2019-06-03 DIAGNOSIS — G3184 Mild cognitive impairment, so stated: Secondary | ICD-10-CM

## 2019-06-03 DIAGNOSIS — R159 Full incontinence of feces: Secondary | ICD-10-CM

## 2019-06-03 NOTE — Progress Notes (Signed)
Provider: Richarda Blade FNP-C  Rosella Crandell, Donalee Citrin, NP  Patient Care Team: Phelix Fudala, Donalee Citrin, NP as PCP - General (Family Medicine)  Extended Emergency Contact Information Primary Emergency Contact: Botkins,Valerie Address: [address]           [address] [place] Home Phone: [phone] Mobile Phone: [phone] Relation: Spouse  Code Status:  Full code  Goals of care: Advanced Directive information Advanced Directives 06/03/2019  Does Patient Have a Medical Advance Directive? No  Would patient like information on creating a medical advance directive? No - Patient declined     Chief Complaint  Patient presents with  . Acute Visit    Requesting home health referral.    HPI:  Pt is a 86 y.o. male seen today for an acute visit for evaluation for   home health referral for Nurse assistance.He is here with care giver spouse who is the primary care provider.she states has been recently sick with vertigo,stenosis and has required medical attention.she states recently went to the hospital for 8 hrs and patient was home alone.Patient was not able to cook for himself due to Rheumatoid arthritis on his fingers. He needs assistance with his Activity of daily living changing incontinent pull ups and showering. Memory loss - Scored 21/30 on MMSE wife reports patient forgetful and confused at times.Had CT scan of the head  05/28/2019 showed no acute intracranial abnormality  But had mild parenchymal volume loss and chronic microangiopathic changes.    Past Medical History:  Diagnosis Date  . Rheumatoid arthritis involving multiple joints (HCC)   . Ruptured appendix   . Ventral hernia    Past Surgical History:  Procedure Laterality Date  . APPENDECTOMY  1978   had peritonitis (ruptured), had exlap  . HAND SURGERY  2005, 2010   multiple hand joint replacements Dr. Amanda Pea  . INGUINAL HERNIA REPAIR Left 08/04/2018   Procedure: OPEN LEFT INGUINAL HERNIA REPAIR WITH  MESH;  Surgeon: Darnell Level, MD;  Location: [facility];  Service: General;  Laterality: Left;  . left hip replacement     Dr. Charlann Boxer  . SHOULDER SURGERY Right 1982  . SHOULDER SURGERY Left 1992    Dr. Valentina Gu    No Known Allergies  Outpatient Encounter Medications as of 06/03/2019  Medication Sig  . ibuprofen (ADVIL,MOTRIN) 200 MG tablet Take 400-600 mg by mouth every 8 (eight) hours as needed (pain.). Reported on 04/28/2015   No facility-administered encounter medications on file as of 06/03/2019.    Review of Systems  Constitutional: Negative for appetite change, chills, fatigue and fever.  HENT: Negative for congestion, rhinorrhea, sinus pressure, sinus pain, sneezing and sore throat.   Eyes: Negative for pain, discharge, redness and itching.  Respiratory: Negative for cough, chest tightness, shortness of breath and wheezing.   Cardiovascular: Negative for chest pain, palpitations and leg swelling.  Gastrointestinal: Negative for abdominal distention, abdominal pain, constipation, diarrhea, nausea and vomiting.  Genitourinary:       Incontinent for urine.  Musculoskeletal: Positive for arthralgias and gait problem. Negative for myalgias.  Skin: Negative for color change, pallor and rash.  Neurological: Negative for dizziness, speech difficulty, light-headedness, numbness and headaches.  Psychiatric/Behavioral: Negative for agitation, behavioral problems, confusion and sleep disturbance. The patient is not nervous/anxious.     Immunization History  Administered Date(s) Administered  . Fluad Quad(high Dose 65+) 01/07/2019  . Influenza,inj,Quad PF,6+ Mos 10/18/2014  . Influenza-Unspecified 10/01/2013  . PFIZER SARS-COV-2 Vaccination 02/12/2019, 03/07/2019  . Pneumococcal  Conjugate-13 04/15/2014  . Pneumococcal Polysaccharide-23 04/11/2015   Pertinent  Health Maintenance Due  Topic Date Due  . INFLUENZA VACCINE  08/02/2019  . PNA vac Low Risk Adult  Completed   Fall Risk  06/03/2019  05/05/2019 04/11/2015 10/18/2014 04/15/2014  Falls in the past year? 0 1 Yes No No  Number falls in past yr: 0 1 2 or more - -  Injury with Fall? 0 1 No - -  Risk for fall due to : - - History of fall(s);Impaired balance/gait;Impaired mobility - -   Functional Status Survey:    Vitals:   06/03/19 1003  BP: 110/68  Pulse: 60  Resp: 16  Temp: 97.8 F (36.6 C)  SpO2: 97%  Weight: 119 lb (54 kg)  Height: 5\' 7"  (1.702 m)   Body mass index is 18.64 kg/m. Physical Exam Vitals reviewed.  Constitutional:      General: He is not in acute distress.    Appearance: He is underweight. He is not ill-appearing.  HENT:     Head: Normocephalic.  Eyes:     General: No scleral icterus.       Right eye: No discharge.        Left eye: No discharge.     Extraocular Movements: Extraocular movements intact.     Conjunctiva/sclera: Conjunctivae normal.     Pupils: Pupils are equal, round, and reactive to light.  Neck:     Vascular: No carotid bruit.  Cardiovascular:     Rate and Rhythm: Normal rate and regular rhythm.     Pulses: Normal pulses.     Heart sounds: Normal heart sounds. No murmur. No friction rub. No gallop.   Pulmonary:     Effort: Pulmonary effort is normal. No respiratory distress.     Breath sounds: Normal breath sounds. No wheezing, rhonchi or rales.  Chest:     Chest wall: No tenderness.  Abdominal:     General: Bowel sounds are normal. There is no distension.     Palpations: Abdomen is soft. There is no mass.     Tenderness: There is no abdominal tenderness. There is no right CVA tenderness, left CVA tenderness, guarding or rebound.  Musculoskeletal:     Right hand: Deformity and tenderness present. Decreased range of motion. Decreased strength. Normal capillary refill. Normal pulse.     Left hand: Deformity and tenderness present. Decreased range of motion. Normal strength. Normal capillary refill. Normal pulse.     Cervical back: Normal range of motion. No rigidity or  tenderness.     Right knee: Deformity present. No erythema, ecchymosis or crepitus. Decreased range of motion.     Left knee: Deformity present. No erythema, ecchymosis or crepitus. Decreased range of motion.     Right lower leg: No edema.     Left lower leg: No edema.     Comments: Unsteady gait walks with cane.  Lymphadenopathy:     Cervical: No cervical adenopathy.  Skin:    General: Skin is warm.     Coloration: Skin is not pale.     Findings: No bruising, erythema or rash.  Neurological:     Mental Status: He is alert.     Cranial Nerves: No cranial nerve deficit.     Motor: No weakness.     Gait: Gait abnormal.     Comments: HOH   Psychiatric:        Mood and Affect: Mood normal.        Speech: Speech  normal.        Behavior: Behavior normal.        Cognition and Memory: Memory is impaired.     Comments: Scored 21/30 on MMSE 05/05/2019     Labs reviewed: Recent Labs    08/04/18 0905 01/07/19 1106 05/05/19 1405  NA 140 137 137  K 4.3 4.3 4.4  CL 106 102 102  CO2 23 27 25   GLUCOSE 106* 97 90  BUN 19 22 25   CREATININE 0.64 0.75 0.80  CALCIUM 8.8* 9.1 8.9   Recent Labs    07/03/18 0906  AST 14  ALT 9  BILITOT 0.4  PROT 7.3   Recent Labs    07/03/18 0906 07/03/18 0906 08/04/18 0905 01/07/19 1106 05/05/19 1405  WBC 7.4   < > 7.7 7.7 7.3  NEUTROABS 4,780  --   --  5,213 4,964  HGB 12.5*   < > 13.6 12.8* 12.2*  HCT 37.6*   < > 43.3 37.8* 37.1*  MCV 88.7   < > 92.5 86.7 88.3  PLT 235   < > 201 214 230   < > = values in this interval not displayed.   Lab Results  Component Value Date   TSH 3.98 05/05/2019   Lab Results  Component Value Date   HGBA1C 5.7 (H) 04/11/2015   Lab Results  Component Value Date   CHOL 104 04/11/2015   HDL 36 (L) 04/11/2015   LDLCALC 54 04/11/2015   TRIG 71 04/11/2015   CHOLHDL 2.9 04/11/2015    Significant Diagnostic Results in last 30 days:  CT HEAD W & WO CONTRAST  Result Date: 05/29/2019 CLINICAL DATA:  Memory  loss. EXAM: CT HEAD WITHOUT AND WITH CONTRAST TECHNIQUE: Contiguous axial images were obtained from the base of the skull through the vertex without and with intravenous contrast CONTRAST:  40mL ISOVUE-300 IOPAMIDOL (ISOVUE-300) INJECTION 61% COMPARISON:  None. FINDINGS: Brain: No evidence of acute infarction, hemorrhage, hydrocephalus, extra-axial collection or mass lesion/mass effect. 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 Mild hypodensity of the periventricular white matter, nonspecific, most likely related to chronic small vessel ischemia. Prominence of the supratentorial ventricles, cerebral and cerebellar sulci reflecting parenchymal volume loss. No focus of abnormal contrast enhancement seen. Vascular: Calcified plaques in the bilateral carotid siphons Skull: Normal. Negative for fracture or focal lesion. Sinuses/Orbits: No acute finding. IMPRESSION: 1. No acute intracranial abnormality. 2. Mild parenchymal volume loss and chronic microangiopathic changes. Electronically Signed   By: Pedro Earls M.D.   On: 05/29/2019 09:51    Assessment/Plan 1. Rheumatoid arthritis involving multiple sites with positive rheumatoid factor (HCC) Worsening arthritic changes to all fingers and knees limiting his activity of daily living. - Ambulatory referral to [facility] for Nurse assistance to assist with activity of daily living. - Additional Home health agency brochure given during visit for [facility] and [facility] for enrichment  - continue current pain regimen.   2. Unsteady gait - continue to ambulate with a cane. - Fall and safety precautions - Ambulatory referral to [facility] for Nurse assistance to assist with activity of daily living.  3. Functional urinary incontinence Wears adult size pull ups requires assistance with peri-care and pulling pants up due to worsening rheumatoid arthritis on fingers,elbows and shoulders.  - Ambulatory referral to [employer] for Nurse assistance to assist with activity of daily living.   4. Incontinence of feces, unspecified fecal incontinence type Wife reports bowel incontinent sometimes  thinks gas but ends up with bowel incontinence.   Requires assistance with peri-care due to Rheumatoid Arthritis as above. - Ambulatory referral to Home Health for Nurse assistance to assist with activity of daily living.  5.Mild Cognitive impairment  Scored 21/30 on MMSE wife reports patient forgetful and confused at times. - continue with supportive care. - Ambulatory referral to Home Health for Nurse assistance to assist with activity of daily living.  Family/ staff Communication: Reviewed plan of care with patient and wife verbalized understanding.  Labs/tests ordered: None   Next Appointment: Has upcoming appointment 07/07/2019   Caesar Bookman, NP

## 2019-06-03 NOTE — Patient Instructions (Signed)
-   Referral place for home health Nurse assistance to assist with Activity of daily living.Home health Agency will call you for appoint ment.

## 2019-06-30 ENCOUNTER — Ambulatory Visit: Payer: Self-pay | Admitting: Family

## 2019-07-03 ENCOUNTER — Ambulatory Visit (INDEPENDENT_AMBULATORY_CARE_PROVIDER_SITE_OTHER): Payer: Federal, State, Local not specified - PPO | Admitting: Family

## 2019-07-03 ENCOUNTER — Encounter: Payer: Self-pay | Admitting: Family

## 2019-07-03 ENCOUNTER — Other Ambulatory Visit: Payer: Self-pay

## 2019-07-03 VITALS — BP 130/70 | HR 89 | Temp 97.3°F | Resp 16 | Ht 67.0 in | Wt 121.0 lb

## 2019-07-03 DIAGNOSIS — Z0289 Encounter for other administrative examinations: Secondary | ICD-10-CM

## 2019-07-03 DIAGNOSIS — R159 Full incontinence of feces: Secondary | ICD-10-CM

## 2019-07-03 DIAGNOSIS — R3981 Functional urinary incontinence: Secondary | ICD-10-CM

## 2019-07-03 DIAGNOSIS — M0579 Rheumatoid arthritis with rheumatoid factor of multiple sites without organ or systems involvement: Secondary | ICD-10-CM

## 2019-07-03 DIAGNOSIS — M25562 Pain in left knee: Secondary | ICD-10-CM

## 2019-07-03 DIAGNOSIS — R413 Other amnesia: Secondary | ICD-10-CM | POA: Diagnosis not present

## 2019-07-03 DIAGNOSIS — G8929 Other chronic pain: Secondary | ICD-10-CM

## 2019-07-03 DIAGNOSIS — R2681 Unsteadiness on feet: Secondary | ICD-10-CM

## 2019-07-03 DIAGNOSIS — M25561 Pain in right knee: Secondary | ICD-10-CM

## 2019-07-03 NOTE — Progress Notes (Signed)
Provider: Marlowe Sax FNP-C  Kevontae Burgoon, Nelda Bucks, NP  Patient Care Team: Lizmary Nader, Nelda Bucks, NP as PCP - General (Family Medicine)  Extended Emergency Contact Information Primary Emergency Contact: Galindez,Valerie Address: [address]          [place] [address] [place] of [place] Phone: [phone] Mobile Phone: [phone] Relation: Spouse  Code Status: Full Code  Goals of care: Advanced Directive information Advanced Directives 07/03/2019  Does Patient Have a Medical Advance Directive? Yes  Type of Advance Directive [place] in Chart? No - copy requested  Would patient like information on creating a medical advance directive? -     Chief Complaint  Patient presents with   Form Completion    Complete form for [place].    HPI:  Pt is a 86 y.o. male seen today for an acute visit to complete forms for [place] assistance.He needs assistance with his ADL's.He was Previously referred to Home health but wife states services not covered by his insurance.He request forms filled today for home health assistance through department of [place].He requires assistance with his activity of daily living such as cooking,feeding,bathing and dressing.He has worsening Rheumatoid arthritis deformities on both hands and knees affecting his activity of daily living.Also has limited range of motion to both shoulders due to arthritis unable to raise arms above his head.Wife has been able to assist with his ADL's but recently had her own health issues.she was in the hospital while away patient was unable to prepare own food or eat anything until wife came home.He walks with a cane but remains high risk for falls due to his chronic bilateral knee pain.   Past Medical History:  Diagnosis Date   Rheumatoid arthritis involving multiple joints ([place])    Ruptured appendix    Ventral hernia    Past Surgical History:   Procedure Laterality Date   APPENDECTOMY  1978   had peritonitis (ruptured), had exlap   HAND SURGERY  2005, 2010   multiple hand joint replacements Dr. Amedeo Plenty   INGUINAL HERNIA REPAIR Left 08/04/2018   Procedure: OPEN LEFT INGUINAL HERNIA REPAIR WITH MESH;  Surgeon: Armandina Gemma, MD;  Location: [facility];  Service: General;  Laterality: Left;   left hip replacement     Dr. Alvan Dame   SHOULDER SURGERY Right 1982   SHOULDER SURGERY Left 1992    Dr. Lorre Nick    No Known Allergies  Outpatient Encounter Medications as of 07/03/2019  Medication Sig   ibuprofen (ADVIL,MOTRIN) 200 MG tablet Take 400-600 mg by mouth every 8 (eight) hours as needed (pain.). Reported on 04/28/2015   No facility-administered encounter medications on file as of 07/03/2019.    Review of Systems  Constitutional: Negative for appetite change, chills and fatigue.  HENT: Positive for hearing loss. Negative for congestion, ear pain, postnasal drip, rhinorrhea, sinus pressure, sinus pain, sneezing, sore throat and tinnitus.   Eyes: Negative for discharge, redness and itching.  Respiratory: Negative for cough, chest tightness, shortness of breath and wheezing.   Cardiovascular: Negative for chest pain, palpitations and leg swelling.  Gastrointestinal: Negative for abdominal distention, abdominal pain, constipation, diarrhea, nausea and vomiting.  Endocrine: Negative for cold intolerance, heat intolerance, polydipsia, polyphagia and polyuria.  Genitourinary: Negative for frequency and urgency.       Incontinent   Musculoskeletal: Positive for arthralgias and gait problem. Negative for joint swelling and myalgias.  Skin: Negative for color change,  pallor and rash.  Neurological: Negative for dizziness, speech difficulty, weakness, light-headedness, numbness and headaches.  Hematological: Does not bruise/bleed easily.  Psychiatric/Behavioral: Negative for agitation, behavioral problems, hallucinations, self-injury and sleep  disturbance. The patient is not nervous/anxious.     Immunization History  Administered Date(s) Administered   Fluad Quad(high Dose 65+) 01/07/2019   Influenza,inj,Quad PF,6+ Mos 10/18/2014   Influenza-Unspecified 10/01/2013   PFIZER SARS-COV-2 Vaccination 02/12/2019, 03/07/2019   Pneumococcal Conjugate-13 04/15/2014   Pneumococcal Polysaccharide-23 04/11/2015   Pertinent  Health Maintenance Due  Topic Date Due   INFLUENZA VACCINE  08/02/2019   PNA vac Low Risk Adult  Completed   Fall Risk  07/03/2019 06/03/2019 05/05/2019 04/11/2015 10/18/2014  Falls in the past year? 0 0 1 Yes No  Number falls in past yr: 0 0 1 2 or more -  Injury with Fall? 0 0 1 No -  Risk for fall due to : - - - History of fall(s);Impaired balance/gait;Impaired mobility -      Vitals:   07/03/19 0914  BP: 130/70  Pulse: 89  Resp: 16  Temp: (!) 97.3 F (36.3 C)  SpO2: 90%  Weight: 121 lb (54.9 kg)  Height: _0  (1.702 m)   Body mass index is 18.95 kg/m. Physical Exam Vitals reviewed.  Constitutional:      General: He is not in acute distress.    Appearance: He is underweight. He is not ill-appearing.  HENT:     Head: Normocephalic.     Nose: Nose normal. No congestion or rhinorrhea.     Mouth/Throat:     Mouth: Mucous membranes are moist.     Pharynx: Oropharynx is clear. No oropharyngeal exudate or posterior oropharyngeal erythema.  Eyes:     General: No scleral icterus.       Right eye: No discharge.        Left eye: No discharge.     Extraocular Movements: Extraocular movements intact.     Conjunctiva/sclera: Conjunctivae normal.     Pupils: Pupils are equal, round, and reactive to light.  Neck:     Vascular: No carotid bruit.     Comments: Limited ROM looking over right and left shoulder  Cardiovascular:     Rate and Rhythm: Normal rate and regular rhythm.     Pulses: Normal pulses.     Heart sounds: Normal heart sounds. No murmur heard.  No friction rub. No gallop.    Pulmonary:     Effort: Pulmonary effort is normal. No respiratory distress.     Breath sounds: Normal breath sounds. No wheezing, rhonchi or rales.  Chest:     Chest wall: No tenderness.  Abdominal:     General: Bowel sounds are normal. There is no distension.     Palpations: Abdomen is soft. There is no mass.     Tenderness: There is no abdominal tenderness. There is no right CVA tenderness, left CVA tenderness, guarding or rebound.  Musculoskeletal:     Cervical back: No rigidity or tenderness.     Right knee: Deformity and effusion present. No erythema or ecchymosis. Decreased range of motion. No tenderness.     Left knee: Effusion present. No erythema or ecchymosis. Decreased range of motion. No tenderness.     Right lower leg: No edema.     Left lower leg: No edema.  Lymphadenopathy:     Cervical: No cervical adenopathy.  Skin:    General: Skin is warm and dry.     Coloration: Skin  is not pale.     Findings: No bruising, erythema, lesion or rash.  Neurological:     Mental Status: He is alert.    Labs reviewed: Recent Labs    08/04/18 0905 01/07/19 1106 05/05/19 1405  NA 140 137 137  K 4.3 4.3 4.4  CL 106 102 102  CO2 _0 GLUCOSE 106* 97 90  BUN _1 CREATININE 0.64 0.75 0.80  CALCIUM 8.8* 9.1 8.9    Recent Labs    08/04/18 0905 01/07/19 1106 05/05/19 1405  WBC 7.7 7.7 7.3  NEUTROABS  --  5,213 4,964  HGB 13.6 12.8* 12.2*  HCT 43.3 37.8* 37.1*  MCV 92.5 86.7 88.3  PLT 201 214 230   Lab Results  Component Value Date   TSH 3.98 05/05/2019   Lab Results  Component Value Date   HGBA1C 5.7 (H) 04/11/2015   Lab Results  Component Value Date   CHOL 104 04/11/2015   HDL 36 (L) 04/11/2015   LDLCALC 54 04/11/2015   TRIG 71 04/11/2015   CHOLHDL 2.9 04/11/2015    Significant Diagnostic Results in last 30 days:  No results found.  Assessment/Plan 1. Rheumatoid arthritis involving multiple sites with positive rheumatoid factor (HCC) Worst  on both hands and knees.Requires assistance with his activity of daily living.continue with current pain regimen.Declines further referral to Rheumatologist.  - CBC with Differential/Platelet; Future - CMP with eGFR([facility]); Future  2. Unsteady gait Walks with a cane.Fall and safety precautions advised.will consider therapy if stability worsen.  3. Memory loss Progressive decline expected due to his advance age.continue with supportive care.Forms completed for [facility] assistance with ADL's. - TSH; Future  4. Incontinence of feces, unspecified fecal incontinence type Requires assistance with peri-Care.Forms completed for [facility] assistance with ADL's.  5. Functional urinary incontinence Forms completed for [facility] assistance with ADL's.  6. Chronic pain of both knees Bilateral knee effusion with decreased ROM.Recommended referral to Orthopedic for possible aspiration of fluid and cortisol injection but he continues to decline any further referral.Advised to monitor for any signs of infections.continue current pain regimen.  7. Encounter for completion of form with patient Forms completed for [facility] assistance with ADL's due to worsening Rheumatoid arthritis on both hands,shoulders and knees unable to perform own ADL's.Wife has had own health issues not available to assist patient as usually.       Family/ staff Communication: Reviewed plan of care with patient and wife verbalized understanding.   Labs/tests ordered:  - CBC with Differential/Platelet; Future - CMP with eGFR([facility]); Future - TSH; Future  Next Appointment: 6 months for medical management of chronic issues.   Sandrea Hughs, NP

## 2019-07-07 ENCOUNTER — Ambulatory Visit: Payer: Federal, State, Local not specified - PPO | Admitting: Family

## 2019-07-13 ENCOUNTER — Other Ambulatory Visit: Payer: Self-pay

## 2019-07-13 ENCOUNTER — Ambulatory Visit (INDEPENDENT_AMBULATORY_CARE_PROVIDER_SITE_OTHER): Payer: Federal, State, Local not specified - PPO | Admitting: Podiatry

## 2019-07-13 ENCOUNTER — Encounter: Payer: Self-pay | Admitting: Podiatry

## 2019-07-13 DIAGNOSIS — L84 Corns and callosities: Secondary | ICD-10-CM | POA: Diagnosis not present

## 2019-07-13 DIAGNOSIS — B351 Tinea unguium: Secondary | ICD-10-CM | POA: Diagnosis not present

## 2019-07-13 DIAGNOSIS — M0579 Rheumatoid arthritis with rheumatoid factor of multiple sites without organ or systems involvement: Secondary | ICD-10-CM | POA: Diagnosis not present

## 2019-07-13 DIAGNOSIS — M79674 Pain in right toe(s): Secondary | ICD-10-CM | POA: Diagnosis not present

## 2019-07-13 DIAGNOSIS — M79672 Pain in left foot: Secondary | ICD-10-CM

## 2019-07-13 DIAGNOSIS — M79671 Pain in right foot: Secondary | ICD-10-CM | POA: Diagnosis not present

## 2019-07-13 DIAGNOSIS — M79675 Pain in left toe(s): Secondary | ICD-10-CM

## 2019-07-17 NOTE — Progress Notes (Signed)
Subjective:  Patient ID: [name], male    DOB: 04-08-1932,  MRN: [id number]  86 y.o. male presents with painful callus(es) b/l feet and painful thick toenails that are difficult to trim. Pain interferes with ambulation. Aggravating factors include wearing enclosed shoe gear. Pain is relieved with periodic professional debridement.  Review of Systems: Negative except as noted in the HPI.  Past Medical History:  Diagnosis Date   Rheumatoid arthritis involving multiple joints (HCC)    Ruptured appendix    Ventral hernia    Past Surgical History:  Procedure Laterality Date   APPENDECTOMY  1978   had peritonitis (ruptured), had exlap   HAND SURGERY  2005, 2010   multiple hand joint replacements Dr. Amanda Pea   INGUINAL HERNIA REPAIR Left 08/04/2018   Procedure: OPEN LEFT INGUINAL HERNIA REPAIR WITH MESH;  Surgeon: Darnell Level, MD;  Location: [facility];  Service: General;  Laterality: Left;   left hip replacement     Dr. Charlann Boxer   SHOULDER SURGERY Right 1982   SHOULDER SURGERY Left 1992    Dr. Valentina Gu   Patient Active Problem List   Diagnosis Date Noted   Mild cognitive impairment with memory loss 06/03/2019   Inguinal hernia of left side without obstruction or gangrene 08/02/2018   Posterior left knee pain 04/11/2015   Allergic sinusitis 04/11/2015   Otitis externa of left ear 04/11/2015   Low weight for height 10/18/2014   Rheumatoid arthritis involving multiple sites (HCC) 10/18/2014   Left groin hernia 10/18/2014   Hyperglycemia 10/15/2013   Advanced care planning/counseling discussion 10/15/2013   Loss of weight 07/16/2013   Acquired leg length discrepancy 07/16/2013   Rheumatoid arthritis (HCC) 12/12/2008   Ventral hernia without obstruction or gangrene 12/10/2008    Current Outpatient Medications:    ibuprofen (ADVIL,MOTRIN) 200 MG tablet, Take 400-600 mg by mouth every 8 (eight) hours as needed (pain.). Reported on 04/28/2015, Disp: , Rfl:  No Known  Allergies Social History   Occupational History   Not on file  Tobacco Use   Smoking status: Former Smoker    Packs/day: 0.25    Quit date: 01/01/1958    Years since quitting: 61.5   Smokeless tobacco: Never Used  Vaping Use   Vaping Use: Never used  Substance and Sexual Activity   Alcohol use: Not Currently    Alcohol/week: 0.0 standard drinks    Comment: 1-2 a year   Drug use: No   Sexual activity: Not on file    Objective:   Constitutional Pt is a pleasant 86 y.o. Caucasian male WD, WN in NAD.Marland Kitchen AAO x 3.   Vascular Capillary refill time to digits immediate b/l. Palpable pedal pulses b/l LE. Pedal hair absent. Lower extremity skin temperature gradient within normal limits. No pain with calf compression b/l. No edema noted b/l lower extremities. No ischemia or gangrene noted b/l lower extremities. No cyanosis or clubbing noted.  Neurologic Normal speech. Oriented to person, place, and time. Protective sensation intact 5/5 intact bilaterally with 10g monofilament b/l. Vibratory sensation intact b/l. Proprioception intact bilaterally. Fat pad atrophy plantar aspect of b/l heelpads.  Dermatologic Pedal skin with normal turgor, texture and tone bilaterally. No open wounds bilaterally. No interdigital macerations bilaterally. Toenails 1-5 b/l elongated, discolored, dystrophic, thickened, crumbly with subungual debris and tenderness to dorsal palpation. Hyperkeratotic lesion(s) submet head 1 right foot and plantar aspect b/l heel pads.  No erythema, no edema, no drainage, no flocculence.  Orthopedic: Normal muscle strength 5/5 to all lower  extremity muscle groups bilaterally. No pain crepitus or joint limitation noted with ROM b/l. Fibular deviation of digits 2-5 b/l.   Radiographs: None Assessment:   1. Pain due to onychomycosis of toenails of both feet   2. Callus   3. Pain in both feet   4. Rheumatoid arthritis involving multiple sites with positive rheumatoid factor (HCC)     Plan:  Patient was evaluated and treated and all questions answered.  Onychomycosis with pain -Nails palliatively debridement as below. -Educated on self-care  Procedure: Nail Debridement Rationale: Pain Type of Debridement: manual, sharp debridement. Instrumentation: Nail nipper, rotary burr. Number of Nails: 10  -Toenails 1-5 b/l were debrided in length and girth with sterile nail nippers and dremel without iatrogenic bleeding.  -Callus(es) submet head 1 right foot and plantar aspect b/l heel pads pared utilizing sterile scalpel blade without complication or incident. Total number debrided =3. -Patient to report any pedal injuries to medical professional immediately. -Patient to continue soft, supportive shoe gear daily. -Patient/POA to call should there be question/concern in the interim.  Return in about 3 months (around 10/13/2019) for nail trim.  Freddie Breech, DPM

## 2019-10-20 ENCOUNTER — Ambulatory Visit: Payer: Federal, State, Local not specified - PPO | Admitting: Podiatry

## 2019-10-30 ENCOUNTER — Ambulatory Visit (INDEPENDENT_AMBULATORY_CARE_PROVIDER_SITE_OTHER): Payer: Federal, State, Local not specified - PPO | Admitting: Podiatry

## 2019-10-30 ENCOUNTER — Encounter: Payer: Self-pay | Admitting: Podiatry

## 2019-10-30 ENCOUNTER — Other Ambulatory Visit: Payer: Self-pay

## 2019-10-30 DIAGNOSIS — M79672 Pain in left foot: Secondary | ICD-10-CM

## 2019-10-30 DIAGNOSIS — M79675 Pain in left toe(s): Secondary | ICD-10-CM | POA: Diagnosis not present

## 2019-10-30 DIAGNOSIS — B351 Tinea unguium: Secondary | ICD-10-CM

## 2019-10-30 DIAGNOSIS — M79674 Pain in right toe(s): Secondary | ICD-10-CM | POA: Diagnosis not present

## 2019-10-30 DIAGNOSIS — M79671 Pain in right foot: Secondary | ICD-10-CM

## 2019-10-30 DIAGNOSIS — M0579 Rheumatoid arthritis with rheumatoid factor of multiple sites without organ or systems involvement: Secondary | ICD-10-CM

## 2019-10-30 DIAGNOSIS — M216X2 Other acquired deformities of left foot: Secondary | ICD-10-CM

## 2019-10-30 DIAGNOSIS — M216X1 Other acquired deformities of right foot: Secondary | ICD-10-CM

## 2019-11-01 NOTE — Progress Notes (Signed)
Subjective:  Patient ID: [name], male    DOB: April 28, 1932,  MRN: [id number]  86 y.o. male presents with painful callus(es) b/l feet and painful thick toenails that are difficult to trim. Pain interferes with ambulation. Aggravating factors include wearing enclosed shoe gear. Pain is relieved with periodic professional debridement.   He states he has pain under 1st metatarsal head right foot.  Review of Systems: Negative except as noted in the HPI.  Past Medical History:  Diagnosis Date   Rheumatoid arthritis involving multiple joints (HCC)    Ruptured appendix    Ventral hernia    Past Surgical History:  Procedure Laterality Date   APPENDECTOMY  1978   had peritonitis (ruptured), had exlap   HAND SURGERY  2005, 2010   multiple hand joint replacements Dr. Amanda Pea   INGUINAL HERNIA REPAIR Left 08/04/2018   Procedure: OPEN LEFT INGUINAL HERNIA REPAIR WITH MESH;  Surgeon: Darnell Level, MD;  Location: [facility];  Service: General;  Laterality: Left;   left hip replacement     Dr. Charlann Boxer   SHOULDER SURGERY Right 1982   SHOULDER SURGERY Left 1992    Dr. Valentina Gu   Patient Active Problem List   Diagnosis Date Noted   Mild cognitive impairment with memory loss 06/03/2019   Inguinal hernia of left side without obstruction or gangrene 08/02/2018   Posterior left knee pain 04/11/2015   Allergic sinusitis 04/11/2015   Otitis externa of left ear 04/11/2015   Low weight for height 10/18/2014   Rheumatoid arthritis involving multiple sites (HCC) 10/18/2014   Left groin hernia 10/18/2014   Hyperglycemia 10/15/2013   Advanced care planning/counseling discussion 10/15/2013   Loss of weight 07/16/2013   Acquired leg length discrepancy 07/16/2013   Rheumatoid arthritis (HCC) 12/12/2008   Ventral hernia without obstruction or gangrene 12/10/2008    Current Outpatient Medications:    ibuprofen (ADVIL,MOTRIN) 200 MG tablet, Take 400-600 mg by mouth every 8 (eight) hours  as needed (pain.). Reported on 04/28/2015, Disp: , Rfl:  No Known Allergies Social History   Occupational History   Not on file  Tobacco Use   Smoking status: Former Smoker    Packs/day: 0.25    Quit date: 01/01/1958    Years since quitting: 61.8   Smokeless tobacco: Never Used  Vaping Use   Vaping Use: Never used  Substance and Sexual Activity   Alcohol use: Not Currently    Alcohol/week: 0.0 standard drinks    Comment: 1-2 a year   Drug use: No   Sexual activity: Not on file    Objective:   Constitutional Pt is a pleasant 86 y.o. Caucasian male WD, WN in NAD.Marland Kitchen AAO x 3.   Vascular Capillary refill time to digits immediate b/l. Palpable pedal pulses b/l LE. Pedal hair absent. Lower extremity skin temperature gradient within normal limits. No pain with calf compression b/l. No edema noted b/l lower extremities. No ischemia or gangrene noted b/l lower extremities. No cyanosis or clubbing noted.  Neurologic Normal speech. Oriented to person, place, and time. Calluses resolved b/l heels and submet head 1 right foot. Protective sensation intact 5/5 intact bilaterally with 10g monofilament b/l. Vibratory sensation intact b/l. Proprioception intact bilaterally. Fat pad atrophy plantar aspect of b/l heelpads.  Dermatologic Pedal skin with normal turgor, texture and tone bilaterally. No open wounds bilaterally. No interdigital macerations bilaterally. Toenails 1-5 b/l elongated, discolored, dystrophic, thickened, crumbly with subungual debris and tenderness to dorsal palpation.  Orthopedic: Normal muscle strength 5/5 to all  lower extremity muscle groups bilaterally. No pain crepitus or joint limitation noted with ROM b/l. Plantarflexed metatarsal(s) 1stt foot and 1st right foot. Fibular deviation of digits 2-5 b/l. Wearing Croc style clogs.   Radiographs: None Assessment:   1. Pain due to onychomycosis of toenails of both feet   2. Plantar flexed metatarsal, right   3. Plantar flexed  metatarsal, left   4. Pain in both feet   5. Rheumatoid arthritis involving multiple sites with positive rheumatoid factor (HCC)    Plan:  Patient was evaluated and treated and all questions answered.  Onychomycosis with pain -Nails palliatively debridement as below. -Educated on self-care  Procedure: Nail Debridement Rationale: Pain Type of Debridement: manual, sharp debridement. Instrumentation: Nail nipper, rotary burr. Number of Nails: 10  -Toenails 1-5 b/l were debrided in length and girth with sterile nail nippers and dremel without iatrogenic bleeding.  -Placed felt callus pad in right shoe. If it becomes uncomfortable, he may remove it. -Patient to report any pedal injuries to medical professional immediately. -Patient to continue soft, supportive shoe gear daily. -Patient/POA to call should there be question/concern in the interim.  Return in about 3 months (around 01/30/2020).  Freddie Breech, DPM

## 2020-01-05 ENCOUNTER — Other Ambulatory Visit: Payer: Self-pay

## 2020-01-05 ENCOUNTER — Other Ambulatory Visit: Payer: Federal, State, Local not specified - PPO

## 2020-01-05 DIAGNOSIS — M0579 Rheumatoid arthritis with rheumatoid factor of multiple sites without organ or systems involvement: Secondary | ICD-10-CM

## 2020-01-05 DIAGNOSIS — R413 Other amnesia: Secondary | ICD-10-CM

## 2020-01-08 ENCOUNTER — Ambulatory Visit (INDEPENDENT_AMBULATORY_CARE_PROVIDER_SITE_OTHER): Payer: Federal, State, Local not specified - PPO | Admitting: Family

## 2020-01-08 ENCOUNTER — Emergency Department (HOSPITAL_COMMUNITY)
Admission: EM | Admit: 2020-01-08 | Discharge: 2020-01-08 | Disposition: A | Discharge status: Home / Self Care | Payer: Federal, State, Local not specified - PPO | Attending: Emergency Medicine | Admitting: Emergency Medicine

## 2020-01-08 ENCOUNTER — Emergency Department (HOSPITAL_COMMUNITY): Payer: Federal, State, Local not specified - PPO

## 2020-01-08 ENCOUNTER — Encounter: Payer: Self-pay | Admitting: Family

## 2020-01-08 ENCOUNTER — Other Ambulatory Visit: Payer: Self-pay

## 2020-01-08 ENCOUNTER — Encounter (HOSPITAL_COMMUNITY): Payer: Self-pay

## 2020-01-08 VITALS — BP 130/70 | HR 72 | Temp 98.0°F | Resp 16 | Ht 67.0 in | Wt 128.0 lb

## 2020-01-08 DIAGNOSIS — Z87891 Personal history of nicotine dependence: Secondary | ICD-10-CM | POA: Insufficient documentation

## 2020-01-08 DIAGNOSIS — R2681 Unsteadiness on feet: Secondary | ICD-10-CM | POA: Diagnosis not present

## 2020-01-08 DIAGNOSIS — S0101XA Laceration without foreign body of scalp, initial encounter: Secondary | ICD-10-CM | POA: Diagnosis not present

## 2020-01-08 DIAGNOSIS — Z23 Encounter for immunization: Secondary | ICD-10-CM | POA: Insufficient documentation

## 2020-01-08 DIAGNOSIS — M25562 Pain in left knee: Secondary | ICD-10-CM

## 2020-01-08 DIAGNOSIS — M0579 Rheumatoid arthritis with rheumatoid factor of multiple sites without organ or systems involvement: Secondary | ICD-10-CM

## 2020-01-08 DIAGNOSIS — R7989 Other specified abnormal findings of blood chemistry: Secondary | ICD-10-CM

## 2020-01-08 DIAGNOSIS — W01198A Fall on same level from slipping, tripping and stumbling with subsequent striking against other object, initial encounter: Secondary | ICD-10-CM | POA: Insufficient documentation

## 2020-01-08 DIAGNOSIS — Y92009 Unspecified place in unspecified non-institutional (private) residence as the place of occurrence of the external cause: Secondary | ICD-10-CM | POA: Diagnosis not present

## 2020-01-08 DIAGNOSIS — M542 Cervicalgia: Secondary | ICD-10-CM | POA: Diagnosis not present

## 2020-01-08 DIAGNOSIS — S0990XA Unspecified injury of head, initial encounter: Secondary | ICD-10-CM | POA: Diagnosis present

## 2020-01-08 DIAGNOSIS — G8929 Other chronic pain: Secondary | ICD-10-CM

## 2020-01-08 DIAGNOSIS — M25561 Pain in right knee: Secondary | ICD-10-CM

## 2020-01-08 MED ORDER — TETANUS-DIPHTH-ACELL PERTUSSIS 5-2.5-18.5 LF-MCG/0.5 IM SUSY
0.5000 mL | PREFILLED_SYRINGE | Freq: Once | INTRAMUSCULAR | Status: AC
  Administered 2020-01-08: 0.5 mL via INTRAMUSCULAR
  Filled 2020-01-08: qty 0.5

## 2020-01-08 MED ORDER — TETANUS-DIPHTH-ACELL PERTUSSIS 5-2-15.5 LF-MCG/0.5 IM SUSP: 0.5000 mL | Freq: Once | INTRAMUSCULAR | 0 refills | Status: AC

## 2020-01-08 NOTE — Progress Notes (Signed)
Provider: Marlowe Sax FNP-C   Evanee Lubrano, Nelda Bucks, NP  Patient Care Team: Penney Domanski, Nelda Bucks, NP as PCP - General (Family Medicine)  Extended Emergency Contact Information Primary Emergency Contact: Siefken,Valerie Address: [address]          [place] [address] [place] Phone: [phone] Mobile Phone: [phone] Relation: Spouse  Code Status: Full Code  Goals of care: Advanced Directive information Advanced Directives 01/08/2020  Does Patient Have a Medical Advance Directive? No  Type of Advance Directive -  Copy of [place] in Chart? -  Would patient like information on creating a medical advance directive? No - Patient declined     Chief Complaint  Patient presents with  . Medical Management of Chronic Issues    6 month follow up.  . Immunizations    Discuss the need for Covid vaccine, Influenza Vaccine, and Tetanus Vaccine.  . Concern    High Fall Risk.    HPI:  Pt is a 87 y.o. male seen today for 6 months follow up for medical management of chronic diseases. He is here with wife.He denies any acute issues today.Wife states were not able to get service from [place] for Aides to assist with ADL's.States had meals on wheels but they stopped.States meals came in small tray not enough " I would loss weight being on such meal".But very appreciative for the work of meals on wheels.Wife has been cooking meals.   Rheumatoid Arthritis - severe on hands/fingers.Takes ibuprofen as needed for pain.no further treatment with specialist desired.   Memory loss - wife states continues to be forgetful.sometimes ask " How many children do I have" but overall doing well. Appetite has been good.Has gain 7 lbs si  Unsteady gait - uses a walker and a cane.had a fall on thanksgiving when trying to get out of the car lost his footing and fell.  Chronic knee pain - pain controlled on ibuprofen   COVID-19 vaccine - completed COVID-19 vaccine.booster  vaccine updated today by CMA.   Influenza vaccine - Due today.denies any symptoms of URI's.Agreed to get flu shot today.   TDap vaccine  - will send to pharmacy.   Recent lab work reviewed and discussed with patient and wife.TSH level elevated at 6.37 denies any symptoms of hypothyroidism.Wiil add T3 and T4 to current lab work.    Past Medical History:  Diagnosis Date  . Rheumatoid arthritis involving multiple joints ([place])   . Ruptured appendix   . Ventral hernia    Past Surgical History:  Procedure Laterality Date  . APPENDECTOMY  1978   had peritonitis (ruptured), had exlap  . HAND SURGERY  2005, 2010   multiple hand joint replacements Dr. Amedeo Plenty  . INGUINAL HERNIA REPAIR Left 08/04/2018   Procedure: OPEN LEFT INGUINAL HERNIA REPAIR WITH MESH;  Surgeon: Armandina Gemma, MD;  Location: [facility];  Service: General;  Laterality: Left;  . left hip replacement     Dr. Alvan Dame  . SHOULDER SURGERY Right 1982  . SHOULDER SURGERY Left 1992    Dr. Lorre Nick    No Known Allergies  Allergies as of 01/08/2020   No Known Allergies     Medication List       Accurate as of January 08, 2020  9:41 AM. If you have any questions, ask your nurse or doctor.        ibuprofen 200 MG tablet Commonly known as: ADVIL Take 400-600 mg by mouth every 8 (eight)  hours as needed (pain.). Reported on 04/28/2015       Review of Systems  Constitutional: Negative for appetite change, chills, fatigue and fever.  HENT: Positive for hearing loss. Negative for congestion, rhinorrhea, sinus pressure, sinus pain, sneezing, sore throat and trouble swallowing.   Eyes: Negative for discharge, redness and itching.  Respiratory: Negative for cough, chest tightness, shortness of breath and wheezing.   Cardiovascular: Negative for chest pain, palpitations and leg swelling.  Gastrointestinal: Negative for abdominal distention, abdominal pain, constipation, diarrhea, nausea and vomiting.  Endocrine: Negative for cold  intolerance, heat intolerance, polydipsia, polyphagia and polyuria.  Genitourinary:       Incontinent   Musculoskeletal: Positive for arthralgias and gait problem. Negative for joint swelling, myalgias and neck pain.  Skin: Negative for color change, pallor and rash.  Neurological: Negative for dizziness, speech difficulty, weakness, light-headedness, numbness and headaches.  Hematological: Does not bruise/bleed easily.  Psychiatric/Behavioral: Negative for agitation, behavioral problems and sleep disturbance. The patient is not nervous/anxious.        Memory loss     Immunization History  Administered Date(s) Administered  . Fluad Quad(high Dose 65+) 01/07/2019  . Influenza,inj,Quad PF,6+ Mos 10/18/2014  . Influenza-Unspecified 10/01/2013  . PFIZER SARS-COV-2 Vaccination 02/12/2019, 03/07/2019, 11/01/2019  . Pneumococcal Conjugate-13 04/15/2014  . Pneumococcal Polysaccharide-23 04/11/2015   Pertinent  Health Maintenance Due  Topic Date Due  . INFLUENZA VACCINE  08/02/2019  . PNA vac Low Risk Adult  Completed   Fall Risk  01/08/2020 07/03/2019 06/03/2019 05/05/2019 04/11/2015  Falls in the past year? 1 0 0 1 Yes  Number falls in past yr: 1 0 0 1 2 or more  Injury with Fall? 1 0 0 1 No  Risk for fall due to : - - - - History of fall(s);Impaired balance/gait;Impaired mobility   Functional Status Survey:    Vitals:   01/08/20 0912  BP: 130/70  Pulse: 72  Resp: 16  Temp: 98 F (36.7 C)  SpO2: 90%  Weight: 128 lb (58.1 kg)  Height: _0  (1.702 m)   Body mass index is 20.05 kg/m. Physical Exam Vitals reviewed.  Constitutional:      General: He is not in acute distress.    Appearance: He is normal weight. He is not ill-appearing.  HENT:     Head: Normocephalic.     Right Ear: Tympanic membrane, ear canal and external ear normal. There is no impacted cerumen.     Left Ear: Ear canal and external ear normal. There is no impacted cerumen.     Nose: Nose normal. No congestion or  rhinorrhea.     Mouth/Throat:     Mouth: Mucous membranes are moist.     Pharynx: Oropharynx is clear. No oropharyngeal exudate or posterior oropharyngeal erythema.  Eyes:     General: No scleral icterus.       Right eye: No discharge.        Left eye: No discharge.     Extraocular Movements: Extraocular movements intact.     Conjunctiva/sclera: Conjunctivae normal.     Pupils: Pupils are equal, round, and reactive to light.  Neck:     Vascular: No carotid bruit.  Cardiovascular:     Rate and Rhythm: Normal rate and regular rhythm.     Pulses: Normal pulses.     Heart sounds: Normal heart sounds. No murmur heard. No friction rub. No gallop.   Pulmonary:     Effort: Pulmonary effort is normal. No respiratory distress.  Breath sounds: Normal breath sounds. No wheezing, rhonchi or rales.  Chest:     Chest wall: No tenderness.  Abdominal:     General: Bowel sounds are normal. There is no distension.     Palpations: Abdomen is soft. There is no mass.     Tenderness: There is no abdominal tenderness. There is no right CVA tenderness, left CVA tenderness, guarding or rebound.  Musculoskeletal:        General: Deformity present. No swelling or tenderness.     Cervical back: Normal range of motion. No rigidity or tenderness.     Right lower leg: No edema.     Left lower leg: No edema.     Comments: unsteady gait.severe arthritic changes on hand/fingers   Lymphadenopathy:     Cervical: No cervical adenopathy.  Skin:    General: Skin is warm and dry.     Coloration: Skin is not pale.     Findings: No bruising, erythema or rash.  Neurological:     Mental Status: He is alert.     Cranial Nerves: No cranial nerve deficit.     Sensory: No sensory deficit.     Motor: No weakness.     Coordination: Coordination normal.     Gait: Gait abnormal.     Comments: Forgetful  HOH  Psychiatric:        Mood and Affect: Mood normal.        Behavior: Behavior normal.        Thought Content:  Thought content normal.        Judgment: Judgment normal.     Comments: Forgetful      Labs reviewed: Recent Labs    05/05/19 1405 01/05/20 0810  NA 137 138  K 4.4 4.4  CL 102 103  CO2 25 28  GLUCOSE 90 89  BUN 25 23  CREATININE 0.80 0.92  CALCIUM 8.9 9.0   Recent Labs    01/05/20 0810  AST 17  ALT 12  BILITOT 0.3  PROT 7.5   Recent Labs    05/05/19 1405 01/05/20 0810  WBC 7.3 7.3  NEUTROABS 4,964 4,636  HGB 12.2* 13.3  HCT 37.1* 39.5  MCV 88.3 88.2  PLT 230 228   Lab Results  Component Value Date   TSH 6.37 (H) 01/05/2020   Lab Results  Component Value Date   HGBA1C 5.7 (H) 04/11/2015   Lab Results  Component Value Date   CHOL 104 04/11/2015   HDL 36 (L) 04/11/2015   LDLCALC 54 04/11/2015   TRIG 71 04/11/2015   CHOLHDL 2.9 04/11/2015    Significant Diagnostic Results in last 30 days:  No results found.  Assessment/Plan 1. Rheumatoid arthritis involving multiple sites with positive rheumatoid factor ([place]) Worst on hand/fingers.continue on current pain regimen. - continue with supportive  - CBC with Differential/Platelet; Future - CMP with eGFR([facility]); Future  2. Unsteady gait Ambulates with walker and a cane.Had fall episode over thanksgiving 2021 trying to getting off the care lost his footing.No other fall since then.  - Fall and safety precautions advised.  - CBC with Differential/Platelet; Future  3. Chronic pain of both knees - pain managed on Ibuprofen works best for him compared to tylenol.  4. Need for Tdap vaccination Advised to get his Tdap vaccine at the pharmacy wife verbalized understanding.  - Tdap (ADACEL) 05-02-13.5 LF-MCG/0.5 injection; Inject 0.5 mLs into the muscle once for 1 dose.  Dispense: 0.5 mL; Refill: 0  5. Need for  influenza vaccination No URI's symptoms.Afebrile. Flu vaccine administered today by CMA no reaction noted.  - Flu Vaccine QUAD High Dose(Fluad)  6.Abnormal TSH level  Lab Results  Component Value  Date   TSH 6.37 (H) 01/05/2020   Discussed lab results.No symptoms of hypothyroidism.Will add T3 and T 4 to current lab results.Result note already send to CMA to add labs.   Family/ staff Communication: Reviewed plan of care with patient and wife.  Labs/tests ordered:   - CBC with Differential/Platelet; Future - CMP with eGFR([facility]); Future  Next Appointment : 6 months for medical management of chronic issues. Fasting labs prior to visit.   Sandrea Hughs, NP

## 2020-01-08 NOTE — ED Provider Notes (Signed)
[facility] DEPT Provider Note   CSN: [id number] Arrival date & time: 01/08/20  2039     History Chief Complaint  Patient presents with  . Fall    [name] is a 87 y.o. male.  HPI 87 year old male presents with a trip and fall and head injury.  Around 730 he tripped while trying to go turn off the TV and hit his head on the wall.  Did not lose consciousness.  There was no near syncope or syncope.  He is not on blood thinners.  Has a wound that is bleeding on his scalp.  Some transient neck pain.  He took ibuprofen for both of these and now has minimal to no headache and no neck pain.   Past Medical History:  Diagnosis Date  . Rheumatoid arthritis involving multiple joints ([place])   . Ruptured appendix   . Ventral hernia     Patient Active Problem List   Diagnosis Date Noted  . Mild cognitive impairment with memory loss 06/03/2019  . Inguinal hernia of left side without obstruction or gangrene 08/02/2018  . Posterior left knee pain 04/11/2015  . Allergic sinusitis 04/11/2015  . Otitis externa of left ear 04/11/2015  . Low weight for height 10/18/2014  . Rheumatoid arthritis involving multiple sites ([place]) 10/18/2014  . Left groin hernia 10/18/2014  . Hyperglycemia 10/15/2013  . Advanced care planning/counseling discussion 10/15/2013  . Loss of weight 07/16/2013  . Acquired leg length discrepancy 07/16/2013  . Rheumatoid arthritis ([place]) 12/12/2008  . Ventral hernia without obstruction or gangrene 12/10/2008    Past Surgical History:  Procedure Laterality Date  . APPENDECTOMY  1978   had peritonitis (ruptured), had exlap  . HAND SURGERY  2005, 2010   multiple hand joint replacements Dr. Amedeo Plenty  . INGUINAL HERNIA REPAIR Left 08/04/2018   Procedure: OPEN LEFT INGUINAL HERNIA REPAIR WITH MESH;  Surgeon: Armandina Gemma, MD;  Location: [facility];  Service: General;  Laterality: Left;  . left hip replacement     Dr. Alvan Dame  . SHOULDER SURGERY  Right 1982  . SHOULDER SURGERY Left 1992    Dr. Lorre Nick       Family History  Problem Relation Age of Onset  . Cancer Father   . Diabetes Sister   . Mental illness Sister     Social History   Tobacco Use  . Smoking status: Former Smoker    Packs/day: 0.25    Quit date: 01/01/1958    Years since quitting: 62.0  . Smokeless tobacco: Never Used  Vaping Use  . Vaping Use: Never used  Substance Use Topics  . Alcohol use: Not Currently    Alcohol/week: 0.0 standard drinks    Comment: 1-2 a year  . Drug use: No    Home Medications Prior to Admission medications   Medication Sig Start Date End Date Taking? Authorizing Provider  ibuprofen (ADVIL,MOTRIN) 200 MG tablet Take 400-600 mg by mouth every 8 (eight) hours as needed (pain.). Reported on 04/28/2015    [provider]  Tdap (ADACEL) 05-02-13.5 LF-MCG/0.5 injection Inject 0.5 mLs into the muscle once for 1 dose. 01/08/20 01/08/20  Ngetich, Nelda Bucks, NP    Allergies    Patient has no known allergies.  Review of Systems   Review of Systems  Cardiovascular: Negative for chest pain.  Musculoskeletal: Positive for neck pain.  Neurological: Positive for headaches. Negative for dizziness, syncope, weakness, light-headedness and numbness.  All other systems reviewed and are negative.  Physical Exam Updated Vital Signs BP (!) 156/89   Pulse 84   Temp 98.1 F (36.7 C) (Oral)   Resp 17   Ht 5\' 7"  (1.702 m)   Wt 58.1 kg   SpO2 98%   BMI 20.05 kg/m   Physical Exam Vitals and nursing note reviewed.  Constitutional:      General: He is not in acute distress.    Appearance: He is well-developed and well-nourished. He is not ill-appearing or diaphoretic.  HENT:     Head: Normocephalic. Abrasion and laceration present.      Right Ear: External ear normal.     Left Ear: External ear normal.     Nose: Nose normal.  Eyes:     General:        Right eye: No discharge.        Left eye: No discharge.     Extraocular  Movements: Extraocular movements intact.     Pupils: Pupils are equal, round, and reactive to light.  Cardiovascular:     Rate and Rhythm: Normal rate and regular rhythm.     Heart sounds: Normal heart sounds.  Pulmonary:     Effort: Pulmonary effort is normal.     Breath sounds: Normal breath sounds.  Abdominal:     Palpations: Abdomen is soft.     Tenderness: There is no abdominal tenderness.  Musculoskeletal:        General: No edema.     Cervical back: Neck supple.     Comments: Chronic right arm deformity  Skin:    General: Skin is warm and dry.  Neurological:     Mental Status: He is alert.     Comments: CN 3-12 grossly intact. 5/5 strength in all 4 extremities.   Psychiatric:        Mood and Affect: Mood is not anxious.     ED Results / Procedures / Treatments   Labs (all labs ordered are listed, but only abnormal results are displayed) Labs Reviewed - No data to display  EKG None  Radiology CT Head Wo Contrast  Result Date: 01/08/2020 CLINICAL DATA:  Trip and fall at home on blankets striking head on floor on wall. No loss of consciousness. EXAM: CT HEAD WITHOUT CONTRAST TECHNIQUE: Contiguous axial images were obtained from the base of the skull through the vertex without intravenous contrast. COMPARISON:  Head CT 05/28/2019 FINDINGS: Brain: Stable degree of atrophy and chronic small vessel ischemia. No intracranial hemorrhage, mass effect, or midline shift. No hydrocephalus. The basilar cisterns are patent. No evidence of territorial infarct or acute ischemia. No extra-axial or intracranial fluid collection. Vascular: Atherosclerosis of skullbase vasculature without hyperdense vessel or abnormal calcification. Skull: No fracture or focal lesion. Sinuses/Orbits: Minor mucosal thickening of ethmoid air cells. No acute fracture or sinus fluid level. The mastoid air cells are clear. Other: Midline frontal and parietal scalp hematoma. IMPRESSION: 1. Midline frontal and parietal  scalp hematoma. No acute intracranial abnormality. No skull fracture. 2. Stable atrophy and chronic small vessel ischemia. Electronically Signed   By: 05/30/2019 M.D.   On: 01/08/2020 22:21   CT Cervical Spine Wo Contrast  Result Date: 01/08/2020 CLINICAL DATA:  Neck trauma.  Trip and fall at home on blankets. EXAM: CT CERVICAL SPINE WITHOUT CONTRAST TECHNIQUE: Multidetector CT imaging of the cervical spine was performed without intravenous contrast. Multiplanar CT image reconstructions were also generated. COMPARISON:  None. FINDINGS: Alignment: Straightening of normal lordosis. Trace degenerative anterolisthesis of C5 on C6.  Broad-based levo scoliotic curvature. No jumped or perched facets. Skull base and vertebrae: No acute fracture. Vertebral body heights are maintained. The dens and skull base are intact. Advanced degenerative change at C1-C2. Soft tissues and spinal canal: No prevertebral fluid or swelling. No visible canal hematoma. Disc levels: Diffuse disc space narrowing and endplate spurring. Diffuse multilevel facet hypertrophy with areas of facet ankylosis, C2-C3 on the left, C3-C4 and C4-C5 on the right. Upper chest: Mild emphysema.  No acute findings. Other: Carotid calcifications. IMPRESSION: 1. No acute fracture or subluxation of the cervical spine. 2. Multilevel degenerative disc disease and facet hypertrophy. 3. Straightening of normal lordosis may be positioning or muscle spasm. Electronically Signed   By: Narda Rutherford M.D.   On: 01/08/2020 22:24    Procedures Procedures (including critical care time)  Medications Ordered in ED Medications  Tdap (BOOSTRIX) injection 0.5 mL (has no administration in time range)    ED Course  I have reviewed the triage vital signs and the nursing notes.  Pertinent labs & imaging results that were available during my care of the patient were reviewed by me and considered in my medical decision making (see chart for details).    MDM  Rules/Calculators/A&P                          Patient's bleeding was controlled with direct pressure.  Unfortunately there is nothing to suture as he is just lost too much skin.  This will need to heal by secondary intention.  Discussed this with patient and family.  He was due to get tetanus immunization today but has not gotten it yet so we will update now.  After bleeding was controlled and CT shows no significant trauma, combat gauze was placed.  Will discharge home.  I have personally reviewed the CTs. Final Clinical Impression(s) / ED Diagnoses Final diagnoses:  Laceration of scalp, initial encounter    Rx / DC Orders ED Discharge Orders    None       Pricilla Loveless, MD 01/08/20 2254

## 2020-01-08 NOTE — ED Triage Notes (Signed)
Pt fall at home after tripping on some blankets. Hit head on floor and wall. Denies LOC, and denies anticoagulants. Pt alert and oriented. Laceration to top of head.

## 2020-01-08 NOTE — Discharge Instructions (Signed)
If you develop continued, recurrent, or worsening headache, fever, neck stiffness, vomiting, blurry or double vision, weakness or numbness in your arms or legs, trouble speaking, or any other new/concerning symptoms then return to the ER for evaluation.  

## 2020-01-09 LAB — CBC WITH DIFFERENTIAL/PLATELET
Absolute Monocytes: 679 cells/uL (ref 200–950)
Basophils Absolute: 51 cells/uL (ref 0–200)
Basophils Relative: 0.7 %
Eosinophils Absolute: 277 cells/uL (ref 15–500)
Eosinophils Relative: 3.8 %
HCT: 39.5 % (ref 38.5–50.0)
Hemoglobin: 13.3 g/dL (ref 13.2–17.1)
Lymphs Abs: 1657 cells/uL (ref 850–3900)
MCH: 29.7 pg (ref 27.0–33.0)
MCHC: 33.7 g/dL (ref 32.0–36.0)
MCV: 88.2 fL (ref 80.0–100.0)
MPV: 11.2 fL (ref 7.5–12.5)
Monocytes Relative: 9.3 %
Neutro Abs: 4636 cells/uL (ref 1500–7800)
Neutrophils Relative %: 63.5 %
Platelets: 228 10*3/uL (ref 140–400)
RBC: 4.48 10*6/uL (ref 4.20–5.80)
RDW: 13 % (ref 11.0–15.0)
Total Lymphocyte: 22.7 %
WBC: 7.3 10*3/uL (ref 3.8–10.8)

## 2020-01-09 LAB — COMPLETE METABOLIC PANEL WITH GFR
AG Ratio: 1 (calc) (ref 1.0–2.5)
ALT: 12 U/L (ref 9–46)
AST: 17 U/L (ref 10–35)
Albumin: 3.7 g/dL (ref 3.6–5.1)
Alkaline phosphatase (APISO): 67 U/L (ref 35–144)
BUN: 23 mg/dL (ref 7–25)
CO2: 28 mmol/L (ref 20–32)
Calcium: 9 mg/dL (ref 8.6–10.3)
Chloride: 103 mmol/L (ref 98–110)
Creat: 0.92 mg/dL (ref 0.70–1.11)
GFR, Est African American: 88 mL/min/{1.73_m2} (ref >=60)
GFR, Est Non African American: 76 mL/min/{1.73_m2} (ref >=60)
Globulin: 3.8 g/dL (calc) — ABNORMAL HIGH (ref 1.9–3.7)
Glucose, Bld: 89 mg/dL (ref 65–99)
Potassium: 4.4 mmol/L (ref 3.5–5.3)
Sodium: 138 mmol/L (ref 135–146)
Total Bilirubin: 0.3 mg/dL (ref 0.2–1.2)
Total Protein: 7.5 g/dL (ref 6.1–8.1)

## 2020-01-09 LAB — TSH: TSH: 6.37 mIU/L — ABNORMAL HIGH (ref 0.40–4.50)

## 2020-01-09 LAB — TEST AUTHORIZATION

## 2020-01-09 LAB — T3, FREE: T3, Free: 3.5 pg/mL (ref 2.3–4.2)

## 2020-01-09 LAB — T4, FREE: Free T4: 1.1 ng/dL (ref 0.8–1.8)

## 2020-01-12 ENCOUNTER — Other Ambulatory Visit: Payer: Self-pay

## 2020-01-12 ENCOUNTER — Ambulatory Visit (INDEPENDENT_AMBULATORY_CARE_PROVIDER_SITE_OTHER): Payer: Federal, State, Local not specified - PPO | Admitting: Family

## 2020-01-12 ENCOUNTER — Encounter: Payer: Self-pay | Admitting: Family

## 2020-01-12 VITALS — BP 138/70 | HR 65 | Temp 97.7°F | Ht 67.0 in | Wt 128.0 lb

## 2020-01-12 DIAGNOSIS — T148XXA Other injury of unspecified body region, initial encounter: Secondary | ICD-10-CM

## 2020-01-12 DIAGNOSIS — D62 Acute posthemorrhagic anemia: Secondary | ICD-10-CM | POA: Diagnosis not present

## 2020-01-12 LAB — CBC WITH DIFFERENTIAL/PLATELET
Absolute Monocytes: 874 cells/uL (ref 200–950)
Basophils Absolute: 38 cells/uL (ref 0–200)
Basophils Relative: 0.4 %
Eosinophils Absolute: 250 cells/uL (ref 15–500)
Eosinophils Relative: 2.6 %
HCT: 37.9 % — ABNORMAL LOW (ref 38.5–50.0)
Hemoglobin: 12.7 g/dL — ABNORMAL LOW (ref 13.2–17.1)
Lymphs Abs: 2198 cells/uL (ref 850–3900)
MCH: 28.8 pg (ref 27.0–33.0)
MCHC: 33.5 g/dL (ref 32.0–36.0)
MCV: 85.9 fL (ref 80.0–100.0)
MPV: 10.8 fL (ref 7.5–12.5)
Monocytes Relative: 9.1 %
Neutro Abs: 6240 cells/uL (ref 1500–7800)
Neutrophils Relative %: 65 %
Platelets: 232 10*3/uL (ref 140–400)
RBC: 4.41 10*6/uL (ref 4.20–5.80)
RDW: 12.9 % (ref 11.0–15.0)
Total Lymphocyte: 22.9 %
WBC: 9.6 10*3/uL (ref 3.8–10.8)

## 2020-01-12 NOTE — Progress Notes (Signed)
Provider: Richarda Blade FNP-C  Taniesha Glanz, Donalee Citrin, NP  Patient Care Team: Ginevra Tacker, Donalee Citrin, NP as PCP - General (Family Medicine)  Extended Emergency Contact Information Primary Emergency Contact: Meixner,Valerie Address: [address]          [place] [address] [place] Home Phone: [phone] Mobile Phone: [phone] Relation: Spouse  Code Status:  Full Code  Goals of care: Advanced Directive information Advanced Directives 01/08/2020  Does Patient Have a Medical Advance Directive? No  Type of Advance Directive -  Copy of Healthcare Power of Attorney in Chart? -  Would patient like information on creating a medical advance directive? -     Chief Complaint  Patient presents with  . Acute Visit    F/U fall with head injury. Presents with wife. His head is bandaged and wife asks me not to unwrap it.     HPI:  Pt is a 87 y.o. male seen today for an acute visit for follow up ED visit 01/08/2020 for fall.He is here with his wife.He states tried to get up from his chair to turn the TV off but possible tripped on a blanket hitting his head on the wall.He did not lose consciousness.He had some neck pain and laceration /abrasion on the head which he took ibuprofen.Combat gauze and kerlix was applied in the ED to stop the bleeding.He did not require any suturing.CT scan showed no significant trauma.He was discharged home.wife states no instruction was given for wound care so he brought him today for dressing on the head to be changed.He states pain under controlled with ibuprofen.he denies any fever,chills or drainage from wound site.  Past Medical History:  Diagnosis Date  . Rheumatoid arthritis involving multiple joints ([facility])   . Ruptured appendix   . Ventral hernia    Past Surgical History:  Procedure Laterality Date  . APPENDECTOMY  1978   had peritonitis (ruptured), had exlap  . HAND SURGERY  2005, 2010   multiple hand joint replacements Dr. Amanda Pea  .  INGUINAL HERNIA REPAIR Left 08/04/2018   Procedure: OPEN LEFT INGUINAL HERNIA REPAIR WITH MESH;  Surgeon: Darnell Level, MD;  Location: [facility];  Service: General;  Laterality: Left;  . left hip replacement     Dr. Charlann Boxer  . SHOULDER SURGERY Right 1982  . SHOULDER SURGERY Left 1992    Dr. Valentina Gu    No Known Allergies  Outpatient Encounter Medications as of 01/12/2020  Medication Sig  . ibuprofen (ADVIL,MOTRIN) 200 MG tablet Take 400-600 mg by mouth every 8 (eight) hours as needed (pain.). Reported on 04/28/2015   No facility-administered encounter medications on file as of 01/12/2020.    Review of Systems  Constitutional: Negative for appetite change, chills, fatigue and fever.  HENT: Negative for congestion, rhinorrhea, sinus pressure, sinus pain, sneezing and sore throat.   Eyes: Negative for discharge, redness and itching.  Respiratory: Negative for cough, chest tightness, shortness of breath and wheezing.   Cardiovascular: Negative for chest pain, palpitations and leg swelling.  Gastrointestinal: Negative for abdominal distention, abdominal pain, constipation, diarrhea, nausea and vomiting.  Endocrine: Negative for cold intolerance, heat intolerance, polydipsia, polyphagia and polyuria.  Genitourinary: Negative for difficulty urinating, dysuria, flank pain, frequency and urgency.  Musculoskeletal: Positive for arthralgias and gait problem.  Skin: Negative for pallor and rash.       Wound on head   Neurological: Negative for dizziness, seizures, speech difficulty, weakness, light-headedness, numbness and headaches.  Hematological: Does not bruise/bleed easily.  Psychiatric/Behavioral: Negative for agitation, confusion and sleep disturbance. The patient is not nervous/anxious.     Immunization History  Administered Date(s) Administered  . Fluad Quad(high Dose 65+) 01/07/2019, 01/08/2020  . Influenza,inj,Quad PF,6+ Mos 10/18/2014  . Influenza-Unspecified 10/01/2013  . PFIZER SARS-COV-2  Vaccination 02/12/2019, 03/07/2019, 11/01/2019  . Pneumococcal Conjugate-13 04/15/2014  . Pneumococcal Polysaccharide-23 04/11/2015  . Tdap 01/08/2020   Pertinent  Health Maintenance Due  Topic Date Due  . INFLUENZA VACCINE  Completed  . PNA vac Low Risk Adult  Completed   Fall Risk  01/12/2020 01/08/2020 07/03/2019 06/03/2019 05/05/2019  Falls in the past year? 1 1 0 0 1  Number falls in past yr: 1 1 0 0 1  Injury with Fall? 1 1 0 0 1  Risk for fall due to : - - - - -   Functional Status Survey:    Vitals:   01/12/20 1515  BP: 138/70  Pulse: 65  Temp: 97.7 F (36.5 C)  TempSrc: Temporal  SpO2: 97%  Weight: 128 lb (58.1 kg)  Height: 5\' 7"  (1.702 m)   Body mass index is 20.05 kg/m. Physical Exam Vitals reviewed.  Constitutional:      General: He is not in acute distress.    Appearance: He is normal weight. He is not ill-appearing.  HENT:     Head: Normocephalic.     Mouth/Throat:     Mouth: Mucous membranes are moist.     Pharynx: Oropharynx is clear. No oropharyngeal exudate or posterior oropharyngeal erythema.  Eyes:     General: No scleral icterus.       Right eye: No discharge.        Left eye: No discharge.     Conjunctiva/sclera: Conjunctivae normal.     Pupils: Pupils are equal, round, and reactive to light.  Cardiovascular:     Rate and Rhythm: Normal rate and regular rhythm.     Pulses: Normal pulses.     Heart sounds: Normal heart sounds. No murmur heard. No friction rub. No gallop.   Pulmonary:     Effort: Pulmonary effort is normal. No respiratory distress.     Breath sounds: Normal breath sounds. No wheezing, rhonchi or rales.  Chest:     Chest wall: No tenderness.  Musculoskeletal:        General: No swelling or tenderness.     Right lower leg: No edema.     Left lower leg: No edema.  Skin:    General: Skin is warm and dry.     Coloration: Skin is not pale.     Findings: No erythema or rash.     Comments: Laceration abrasion on crown of head large  layered combat dressing with Kerlix.Kerlix removed without any difficulty.Combat layered guaze irrigated with saline and removed layer by layer but required 4 bottles of saline.Unable to remove last piece of gauze despite irrigation.patient complained of saline being cold and abrasion tenderness.Tylenol given for pain.TAB applied to abrasion edges and covered with a foam dressing.no bleeding or signs of infection.Instucted to soak at home then follow up at [facility] for removal.    Neurological:     Mental Status: He is oriented to person, place, and time.     Sensory: No sensory deficit.     Motor: No weakness.     Coordination: Coordination normal.     Gait: Gait normal.  Psychiatric:        Mood and Affect: Mood normal.  Behavior: Behavior normal.        Thought Content: Thought content normal.        Judgment: Judgment normal.     Labs reviewed: Recent Labs    05/05/19 1405 01/05/20 0810  NA 137 138  K 4.4 4.4  CL 102 103  CO2 25 28  GLUCOSE 90 89  BUN 25 23  CREATININE 0.80 0.92  CALCIUM 8.9 9.0   Recent Labs    01/05/20 0810  AST 17  ALT 12  BILITOT 0.3  PROT 7.5   Recent Labs    05/05/19 1405 01/05/20 0810  WBC 7.3 7.3  NEUTROABS 4,964 4,636  HGB 12.2* 13.3  HCT 37.1* 39.5  MCV 88.3 88.2  PLT 230 228   Lab Results  Component Value Date   TSH 6.37 (H) 01/05/2020   Lab Results  Component Value Date   HGBA1C 5.7 (H) 04/11/2015   Lab Results  Component Value Date   CHOL 104 04/11/2015   HDL 36 (L) 04/11/2015   LDLCALC 54 04/11/2015   TRIG 71 04/11/2015   CHOLHDL 2.9 04/11/2015    Significant Diagnostic Results in last 30 days:  CT Head Wo Contrast  Result Date: 01/08/2020 CLINICAL DATA:  Trip and fall at home on blankets striking head on floor on wall. No loss of consciousness. EXAM: CT HEAD WITHOUT CONTRAST TECHNIQUE: Contiguous axial images were obtained from the base of the skull through the vertex without intravenous contrast. COMPARISON:   Head CT 05/28/2019 FINDINGS: Brain: Stable degree of atrophy and chronic small vessel ischemia. No intracranial hemorrhage, mass effect, or midline shift. No hydrocephalus. The basilar cisterns are patent. No evidence of territorial infarct or acute ischemia. No extra-axial or intracranial fluid collection. Vascular: Atherosclerosis of skullbase vasculature without hyperdense vessel or abnormal calcification. Skull: No fracture or focal lesion. Sinuses/Orbits: Minor mucosal thickening of ethmoid air cells. No acute fracture or sinus fluid level. The mastoid air cells are clear. Other: Midline frontal and parietal scalp hematoma. IMPRESSION: 1. Midline frontal and parietal scalp hematoma. No acute intracranial abnormality. No skull fracture. 2. Stable atrophy and chronic small vessel ischemia. Electronically Signed   By: Narda Rutherford M.D.   On: 01/08/2020 22:21   CT Cervical Spine Wo Contrast  Result Date: 01/08/2020 CLINICAL DATA:  Neck trauma.  Trip and fall at home on blankets. EXAM: CT CERVICAL SPINE WITHOUT CONTRAST TECHNIQUE: Multidetector CT imaging of the cervical spine was performed without intravenous contrast. Multiplanar CT image reconstructions were also generated. COMPARISON:  None. FINDINGS: Alignment: Straightening of normal lordosis. Trace degenerative anterolisthesis of C5 on C6. Broad-based levo scoliotic curvature. No jumped or perched facets. Skull base and vertebrae: No acute fracture. Vertebral body heights are maintained. The dens and skull base are intact. Advanced degenerative change at C1-C2. Soft tissues and spinal canal: No prevertebral fluid or swelling. No visible canal hematoma. Disc levels: Diffuse disc space narrowing and endplate spurring. Diffuse multilevel facet hypertrophy with areas of facet ankylosis, C2-C3 on the left, C3-C4 and C4-C5 on the right. Upper chest: Mild emphysema.  No acute findings. Other: Carotid calcifications. IMPRESSION: 1. No acute fracture or  subluxation of the cervical spine. 2. Multilevel degenerative disc disease and facet hypertrophy. 3. Straightening of normal lordosis may be positioning or muscle spasm. Electronically Signed   By: Narda Rutherford M.D.   On: 01/08/2020 22:24    Assessment/Plan 1. Anemia due to blood loss, acute Latest Hgb 12.2 Asymptomatic.will recheck H/H  - CBC with Differential/Platelets  2. Abrasion/Laceration  Afebrile.wound not visualized.  Laceration abrasion on crown of head large layered combat dressing with Kerlix.Kerlix removed without any difficulty.Combat layered guaze irrigated with saline and removed layer by layer but required 4 bottles of saline.Unable to remove last piece of gauze despite irrigation.patient complained of saline being cold and abrasion tenderness.Tylenol given for pain.TAB applied to abrasion edges and covered with a foam dressing.no bleeding or signs of infection.Instucted to soak at home then follow up at [facility] for removal.Both patient and wife verbalized understanding.   Family/ staff Communication: Reviewed plan of care with patient and wife.  Labs/tests ordered: None   Next Appointment: 1 day for wound evaluation and removal of last piece of combat gauze dressing on head.   Time spent with patient 30 minutes >50% time spent counseling; removing Combat gauze dressing on crown of the head,reviewing medical record; tests; labs; and developing future plan of care   Caesar Bookman, NP

## 2020-01-12 NOTE — Patient Instructions (Signed)
Run warm water over head to remove small dressing then,pat dry and  apply triple antibiotic ointment and cover with foam dressing.Follow up for evaluation at the office.

## 2020-01-13 ENCOUNTER — Telehealth: Payer: Self-pay

## 2020-01-13 ENCOUNTER — Ambulatory Visit: Payer: Federal, State, Local not specified - PPO | Admitting: Family

## 2020-01-13 NOTE — Telephone Encounter (Signed)
Called and spoke with patient and she stated that she would try to call an urgent care and see how long the wait would be. She also stated that she really didn't want to go back to the ED because of the bad job they did when she went the other night.

## 2020-01-13 NOTE — Telephone Encounter (Signed)
Patient's wife called stating that she was in to see Richarda Blade, NP on yesterday for laceration on husband's head. She stated that she was told to saturate the area until the bandages fell off, she stated that she has been saturating it and it has not fallen off,thinks it may need to be surgically removed. Patient was scheduled for appointment today, but she didn't come because they were unable to remove the bandages. Please advise.  Message routed to Richarda Blade, NP

## 2020-01-13 NOTE — Telephone Encounter (Signed)
Noted  

## 2020-01-13 NOTE — Telephone Encounter (Signed)
I recommend either urgent care or ED if surgical removal is desired.

## 2020-01-14 ENCOUNTER — Telehealth: Payer: Self-pay | Admitting: Family

## 2020-01-14 NOTE — Telephone Encounter (Signed)
Patient's wife called on call provider asking if dressing used in ED on patient abrasion/laceration on head was self absorbable.According to ED note Combat gauze was used to stop bleeding.Need removal prior to wound healing.wife has appointment with urgent care 01/14/2020 prefers to be surgically removed.

## 2020-02-05 ENCOUNTER — Ambulatory Visit: Payer: Federal, State, Local not specified - PPO | Admitting: Podiatry

## 2020-04-01 ENCOUNTER — Ambulatory Visit: Payer: Federal, State, Local not specified - PPO | Admitting: Podiatry

## 2020-05-23 ENCOUNTER — Other Ambulatory Visit: Payer: Self-pay

## 2020-05-23 ENCOUNTER — Ambulatory Visit (INDEPENDENT_AMBULATORY_CARE_PROVIDER_SITE_OTHER): Payer: Federal, State, Local not specified - PPO | Admitting: Podiatry

## 2020-05-23 ENCOUNTER — Encounter: Payer: Self-pay | Admitting: Podiatry

## 2020-05-23 DIAGNOSIS — B351 Tinea unguium: Secondary | ICD-10-CM | POA: Diagnosis not present

## 2020-05-23 DIAGNOSIS — M79674 Pain in right toe(s): Secondary | ICD-10-CM

## 2020-05-23 DIAGNOSIS — M79675 Pain in left toe(s): Secondary | ICD-10-CM | POA: Diagnosis not present

## 2020-05-23 NOTE — Progress Notes (Signed)
Subjective: [name] is a pleasant 87 y.o. male patient seen today painful thick toenails that are difficult to trim. Pain interferes with ambulation. Aggravating factors include wearing enclosed shoe gear. Pain is relieved with periodic professional debridement.  He has h/o RA. He voices no new pedal problems on today's visit.  PCP is Ngetich, Donalee Citrin, NP. Last visit was: 01/12/2020.  No Known Allergies  Objective: Physical Exam  General: [name] is a pleasant 87 y.o. Caucasian male, frail, in NAD. AAO x 3.   Vascular:  Capillary refill time to digits immediate b/l. Palpable pedal pulses b/l LE. Pedal hair absent. Lower extremity skin temperature gradient within normal limits. No pain with calf compression b/l. No edema noted b/l lower extremities.  Dermatological:  Pedal skin is thin shiny, atrophic b/l lower extremities. No open wounds bilaterally. No interdigital macerations bilaterally. Toenails 1-5 b/l elongated, discolored, dystrophic, thickened, crumbly with subungual debris and tenderness to dorsal palpation. No hyperkeratotic nor porokeratotic lesions present on today's visit.  Musculoskeletal:  Normal muscle strength 5/5 to all lower extremity muscle groups bilaterally. Fibular deviation of toes 2-5 b/l consistent with RA. No pain crepitus or joint limitation noted with ROM b/l. Plantarflexed metatarsal(s) b/l lower extremities. Hallux valgus with bunion deformity noted b/l lower extremities. Utilizes rollator for ambulation assistance.  Neurological:  Protective sensation intact 5/5 intact bilaterally with 10g monofilament b/l. Vibratory sensation intact b/l.  Assessment and Plan:  1. Pain due to onychomycosis of toenails of both feet     -Examined patient. -Patient to continue soft, supportive shoe gear daily. -Toenails 1-5 b/l were debrided in length and girth with sterile nail nippers and dremel without iatrogenic bleeding.  -Patient to report any pedal  injuries to medical professional immediately. -Patient/POA to call should there be question/concern in the interim.  Return in about 3 months (around 08/23/2020).  Freddie Breech, DPM

## 2020-07-05 ENCOUNTER — Other Ambulatory Visit: Payer: Self-pay

## 2020-07-05 ENCOUNTER — Other Ambulatory Visit: Payer: Federal, State, Local not specified - PPO

## 2020-07-05 DIAGNOSIS — M0579 Rheumatoid arthritis with rheumatoid factor of multiple sites without organ or systems involvement: Secondary | ICD-10-CM

## 2020-07-05 DIAGNOSIS — R2681 Unsteadiness on feet: Secondary | ICD-10-CM

## 2020-07-06 LAB — CBC WITH DIFFERENTIAL/PLATELET
Absolute Monocytes: 591 cells/uL (ref 200–950)
Basophils Absolute: 51 cells/uL (ref 0–200)
Basophils Relative: 0.7 %
Eosinophils Absolute: 234 cells/uL (ref 15–500)
Eosinophils Relative: 3.2 %
HCT: 37 % — ABNORMAL LOW (ref 38.5–50.0)
Hemoglobin: 12.2 g/dL — ABNORMAL LOW (ref 13.2–17.1)
Lymphs Abs: 1672 cells/uL (ref 850–3900)
MCH: 29 pg (ref 27.0–33.0)
MCHC: 33 g/dL (ref 32.0–36.0)
MCV: 87.9 fL (ref 80.0–100.0)
MPV: 10.8 fL (ref 7.5–12.5)
Monocytes Relative: 8.1 %
Neutro Abs: 4752 cells/uL (ref 1500–7800)
Neutrophils Relative %: 65.1 %
Platelets: 215 10*3/uL (ref 140–400)
RBC: 4.21 10*6/uL (ref 4.20–5.80)
RDW: 13.2 % (ref 11.0–15.0)
Total Lymphocyte: 22.9 %
WBC: 7.3 10*3/uL (ref 3.8–10.8)

## 2020-07-06 LAB — COMPLETE METABOLIC PANEL WITH GFR
AG Ratio: 0.9 (calc) — ABNORMAL LOW (ref 1.0–2.5)
ALT: 11 U/L (ref 9–46)
AST: 15 U/L (ref 10–35)
Albumin: 3.5 g/dL — ABNORMAL LOW (ref 3.6–5.1)
Alkaline phosphatase (APISO): 72 U/L (ref 35–144)
BUN/Creatinine Ratio: 30 (calc) — ABNORMAL HIGH (ref 6–22)
BUN: 20 mg/dL (ref 7–25)
CO2: 24 mmol/L (ref 20–32)
Calcium: 9 mg/dL (ref 8.6–10.3)
Chloride: 105 mmol/L (ref 98–110)
Creat: 0.66 mg/dL — ABNORMAL LOW (ref 0.70–1.11)
GFR, Est African American: 101 mL/min/{1.73_m2} (ref >=60)
GFR, Est Non African American: 88 mL/min/{1.73_m2} (ref >=60)
Globulin: 3.7 g/dL (calc) (ref 1.9–3.7)
Glucose, Bld: 90 mg/dL (ref 65–99)
Potassium: 4.1 mmol/L (ref 3.5–5.3)
Sodium: 139 mmol/L (ref 135–146)
Total Bilirubin: 0.4 mg/dL (ref 0.2–1.2)
Total Protein: 7.2 g/dL (ref 6.1–8.1)

## 2020-07-07 ENCOUNTER — Encounter: Payer: Self-pay | Admitting: Family

## 2020-07-07 ENCOUNTER — Ambulatory Visit (INDEPENDENT_AMBULATORY_CARE_PROVIDER_SITE_OTHER): Payer: Federal, State, Local not specified - PPO | Admitting: Family

## 2020-07-07 ENCOUNTER — Other Ambulatory Visit: Payer: Self-pay

## 2020-07-07 VITALS — BP 118/74 | HR 74 | Temp 97.1°F | Wt 122.0 lb

## 2020-07-07 DIAGNOSIS — R350 Frequency of micturition: Secondary | ICD-10-CM

## 2020-07-07 DIAGNOSIS — M25562 Pain in left knee: Secondary | ICD-10-CM

## 2020-07-07 DIAGNOSIS — R2681 Unsteadiness on feet: Secondary | ICD-10-CM | POA: Diagnosis not present

## 2020-07-07 DIAGNOSIS — M25561 Pain in right knee: Secondary | ICD-10-CM | POA: Diagnosis not present

## 2020-07-07 DIAGNOSIS — M0579 Rheumatoid arthritis with rheumatoid factor of multiple sites without organ or systems involvement: Secondary | ICD-10-CM

## 2020-07-07 DIAGNOSIS — G8929 Other chronic pain: Secondary | ICD-10-CM

## 2020-07-07 DIAGNOSIS — D509 Iron deficiency anemia, unspecified: Secondary | ICD-10-CM

## 2020-07-07 DIAGNOSIS — R32 Unspecified urinary incontinence: Secondary | ICD-10-CM

## 2020-07-07 NOTE — Progress Notes (Signed)
Provider: Richarda Blade FNP-C   Beverlie Kurihara, Donalee Citrin, NP  Patient Care Team: Mikinzie Maciejewski, Donalee Citrin, NP as PCP - General (Family Medicine)  Extended Emergency Contact Information Primary Emergency Contact: Steinberger,Valerie Address: [address]          [place] [address] [place] Home Phone: [phone] Mobile Phone: [phone] Relation: Spouse  Code Status:  Full Code  Goals of care: Advanced Directive information Advanced Directives 01/08/2020  Does Patient Have a Medical Advance Directive? No  Type of Advance Directive -  Copy of Healthcare Power of Attorney in Chart? -  Would patient like information on creating a medical advance directive? -     Chief Complaint  Patient presents with   Medical Management of Chronic Issues    6 month follow-up and discuss labs (copy printed). Patient with ongoing knee pain. Here with spouse, Vikki Ports. Discuss need for covid #4 and shingrix or exclude.     HPI:  Pt is a 87 y.o. male seen today for 6 months follow up for medical management of chronic diseases.Has a medical history of Rheumatoid arthritis,urine incontinence among other condition.  He states continue to require at least one ibuprofen and sometimes two tablet per day. He requires assistance with his ADL's.wife states getting harder getting him up to standing position and down the steps at home.Has tried to get assistance through the [place] but not successful. States will consider in home provider if unable to get him out of the house.Tele visit option discussed as option. States probably in a year or so.  He enjoys basking out on the sun.likes tanned skin.He does not wear sun screen but wears a hat and seats under an umbrella but still gets tan skin.   He complains of frequent urination especially at bedtime. Does wear urine incontinent diapers.request catheter to use at night.  He is due for 4 th COVID-19 booster vaccine and Shingrix vaccine. Wife states was not aware  of the 4 th COVID-19 vaccine    Past Medical History:  Diagnosis Date   Rheumatoid arthritis involving multiple joints (HCC)    Ruptured appendix    Ventral hernia    Past Surgical History:  Procedure Laterality Date   APPENDECTOMY  1978   had peritonitis (ruptured), had exlap   HAND SURGERY  2005, 2010   multiple hand joint replacements Dr. Amanda Pea   INGUINAL HERNIA REPAIR Left 08/04/2018   Procedure: OPEN LEFT INGUINAL HERNIA REPAIR WITH MESH;  Surgeon: Darnell Level, MD;  Location: [facility];  Service: General;  Laterality: Left;   left hip replacement     Dr. Charlann Boxer   SHOULDER SURGERY Right 1982   SHOULDER SURGERY Left 1992    Dr. Valentina Gu    No Known Allergies  Allergies as of 07/07/2020   No Known Allergies      Medication List        Accurate as of July 07, 2020 11:07 AM. If you have any questions, ask your nurse or doctor.          ibuprofen 200 MG tablet Commonly known as: ADVIL Take 400-600 mg by mouth every 8 (eight) hours as needed (pain.). Reported on 04/28/2015        Review of Systems  Constitutional:  Negative for appetite change, chills, fatigue, fever and unexpected weight change.  HENT:  Negative for congestion, dental problem, ear discharge, ear pain, facial swelling, hearing loss, nosebleeds, postnasal drip, rhinorrhea, sinus pressure, sinus pain, sneezing, sore throat,  tinnitus and trouble swallowing.   Eyes:  Negative for pain, discharge, redness, itching and visual disturbance.  Respiratory:  Negative for cough, chest tightness, shortness of breath and wheezing.   Cardiovascular:  Negative for chest pain, palpitations and leg swelling.  Gastrointestinal:  Negative for abdominal distention, abdominal pain, blood in stool, constipation, diarrhea, nausea and vomiting.  Endocrine: Negative for cold intolerance, heat intolerance, polydipsia, polyphagia and polyuria.  Genitourinary:  Positive for frequency. Negative for difficulty urinating, dysuria, flank  pain and urgency.       Incontinent   Musculoskeletal:  Positive for arthralgias and gait problem. Negative for back pain, joint swelling, myalgias, neck pain and neck stiffness.  Skin:  Negative for color change, pallor, rash and wound.  Neurological:  Negative for dizziness, syncope, speech difficulty, weakness, light-headedness, numbness and headaches.  Hematological:  Does not bruise/bleed easily.  Psychiatric/Behavioral:  Negative for agitation, behavioral problems, confusion, hallucinations, self-injury, sleep disturbance and suicidal ideas. The patient is not nervous/anxious.    Immunization History  Administered Date(s) Administered   Fluad Quad(high Dose 65+) 01/07/2019, 01/08/2020   Influenza,inj,Quad PF,6+ Mos 10/18/2014   Influenza-Unspecified 10/01/2013   PFIZER(Purple Top)SARS-COV-2 Vaccination 02/12/2019, 03/07/2019, 11/01/2019   Pneumococcal Conjugate-13 04/15/2014   Pneumococcal Polysaccharide-23 04/11/2015   Tdap 01/08/2020   Pertinent  Health Maintenance Due  Topic Date Due   INFLUENZA VACCINE  08/01/2020   PNA vac Low Risk Adult  Completed   Fall Risk  07/07/2020 01/12/2020 01/08/2020 07/03/2019 06/03/2019  Falls in the past year? $RemoveBe'1 1 1 'gaziCUcav$ 0 0  Number falls in past yr: $Remove'1 1 1 'xTgEWdr$ 0 0  Injury with Fall? $RemoveBe'1 1 1 'eMKybTtXv$ 0 0  Risk for fall due to : History of fall(s) - - - -  Follow up Falls evaluation completed - - - -   Functional Status Survey:    Vitals:   07/07/20 1050  BP: 118/74  Pulse: 74  Temp: (!) 97.1 F (36.2 C)  TempSrc: Temporal  SpO2: 96%  Weight: 122 lb (55.3 kg)   Body mass index is 19.11 kg/m. Physical Exam Vitals reviewed.  Constitutional:      General: He is not in acute distress.    Appearance: Normal appearance. He is normal weight. He is not ill-appearing or diaphoretic.  HENT:     Head: Normocephalic.     Right Ear: Tympanic membrane, ear canal and external ear normal. There is no impacted cerumen.     Left Ear: Tympanic membrane, ear canal and  external ear normal. There is no impacted cerumen.     Ears:     Comments: Heard of hearing     Nose: Nose normal. No congestion or rhinorrhea.     Mouth/Throat:     Mouth: Mucous membranes are moist.     Pharynx: Oropharynx is clear. No oropharyngeal exudate or posterior oropharyngeal erythema.  Eyes:     General: No scleral icterus.       Right eye: No discharge.        Left eye: No discharge.     Extraocular Movements: Extraocular movements intact.     Conjunctiva/sclera: Conjunctivae normal.     Pupils: Pupils are equal, round, and reactive to light.  Neck:     Vascular: No carotid bruit.  Cardiovascular:     Rate and Rhythm: Normal rate and regular rhythm.     Pulses: Normal pulses.     Heart sounds: Normal heart sounds. No murmur heard.   No friction rub. No gallop.  Pulmonary:     Effort: Pulmonary effort is normal. No respiratory distress.     Breath sounds: Normal breath sounds. No wheezing, rhonchi or rales.  Chest:     Chest wall: No tenderness.  Abdominal:     General: Bowel sounds are normal. There is no distension.     Palpations: Abdomen is soft. There is no mass.     Tenderness: There is no abdominal tenderness. There is no right CVA tenderness, left CVA tenderness, guarding or rebound.  Musculoskeletal:        General: No swelling or tenderness.     Cervical back: Normal range of motion. No rigidity or tenderness.     Right lower leg: No edema.     Left lower leg: No edema.     Comments: Arthritic changes on fingers and knees   Lymphadenopathy:     Cervical: No cervical adenopathy.  Skin:    General: Skin is warm and dry.     Coloration: Skin is not pale.     Findings: No bruising, erythema, lesion or rash.     Comments: Chronic Large Pink hypopigmented areas on scalp   Neurological:     Mental Status: He is alert and oriented to person, place, and time.     Cranial Nerves: No cranial nerve deficit.     Sensory: No sensory deficit.     Motor: No  weakness.     Coordination: Coordination normal.     Gait: Gait abnormal.  Psychiatric:        Mood and Affect: Mood normal.        Speech: Speech normal.        Behavior: Behavior normal.        Thought Content: Thought content normal.        Judgment: Judgment normal.    Labs reviewed: Recent Labs    01/05/20 0810 07/05/20 0844  NA 138 139  K 4.4 4.1  CL 103 105  CO2 28 24  GLUCOSE 89 90  BUN 23 20  CREATININE 0.92 0.66*  CALCIUM 9.0 9.0   Recent Labs    01/05/20 0810 07/05/20 0844  AST 17 15  ALT 12 11  BILITOT 0.3 0.4  PROT 7.5 7.2   Recent Labs    01/05/20 0810 01/12/20 1738 07/05/20 0844  WBC 7.3 9.6 7.3  NEUTROABS 4,636 6,240 4,752  HGB 13.3 12.7* 12.2*  HCT 39.5 37.9* 37.0*  MCV 88.2 85.9 87.9  PLT 228 232 215   Lab Results  Component Value Date   TSH 6.37 (H) 01/05/2020   Lab Results  Component Value Date   HGBA1C 5.7 (H) 04/11/2015   Lab Results  Component Value Date   CHOL 104 04/11/2015   HDL 36 (L) 04/11/2015   LDLCALC 54 04/11/2015   TRIG 71 04/11/2015   CHOLHDL 2.9 04/11/2015    Significant Diagnostic Results in last 30 days:  No results found.  Assessment/Plan 1. Rheumatoid arthritis involving multiple sites with positive rheumatoid factor (HCC) Continue current pain regimen - CBC with Differential/Platelet; Future - CMP with eGFR([facility]); Future  2. Chronic pain of both knees Continue on Ibuprofen  - advised to take ibuprofen with food.   3. Unsteady gait Fall and safety precaution   4. Urine frequency Will obtain urine specimen to rule out UTI.unable to provide specimen at the office today.Urine specimen and supplies given to collect urine at home then wife will bring to office lab.  - For home use only  DME Other see comment  5. Urinary incontinence, unspecified type Request condom catheter to use at bedtime script written for condom catheter and foley catheter bag to take to medical supply.  - For home use only DME  Other see comment  6. Iron deficiency anemia, unspecified iron deficiency anemia type Mild anemia on recent lab but stable.  Advised to increase iron rich foods such as dark leafy vegetables,beets etc   Family/ staff Communication: Reviewed plan of care with patient and wife verbalized understanding.   Labs/tests ordered:  - CBC with Differential/Platelet; Future - CMP with eGFR([facility]); Future  Next Appointment : 6 months for medical management of chronic issues.Fasting Labs prior to visit.    Sandrea Hughs, NP

## 2020-07-07 NOTE — Patient Instructions (Signed)
-   please collect a clean specimen and bring to office to rule out urinary tract infection

## 2020-07-08 ENCOUNTER — Ambulatory Visit: Payer: Federal, State, Local not specified - PPO | Admitting: Family

## 2020-07-13 ENCOUNTER — Other Ambulatory Visit (INDEPENDENT_AMBULATORY_CARE_PROVIDER_SITE_OTHER): Payer: Federal, State, Local not specified - PPO

## 2020-07-13 DIAGNOSIS — R350 Frequency of micturition: Secondary | ICD-10-CM | POA: Diagnosis not present

## 2020-07-13 LAB — POCT URINALYSIS DIPSTICK
Bilirubin, UA: NEGATIVE
Blood, UA: NEGATIVE
Glucose, UA: NEGATIVE
Leukocytes, UA: NEGATIVE
Nitrite, UA: NEGATIVE
Protein, UA: NEGATIVE
Spec Grav, UA: 1.02 (ref 1.010–1.025)
Urobilinogen, UA: NEGATIVE E.U./dL — AB
pH, UA: 5 (ref 5.0–8.0)

## 2020-07-15 LAB — URINE CULTURE
MICRO NUMBER:: 12118567
Result:: NO GROWTH
SPECIMEN QUALITY:: ADEQUATE

## 2020-08-29 ENCOUNTER — Encounter: Payer: Self-pay | Admitting: Podiatry

## 2020-08-29 ENCOUNTER — Ambulatory Visit (INDEPENDENT_AMBULATORY_CARE_PROVIDER_SITE_OTHER): Payer: Federal, State, Local not specified - PPO | Admitting: Podiatry

## 2020-08-29 ENCOUNTER — Other Ambulatory Visit: Payer: Self-pay

## 2020-08-29 DIAGNOSIS — B351 Tinea unguium: Secondary | ICD-10-CM | POA: Diagnosis not present

## 2020-08-29 DIAGNOSIS — M79675 Pain in left toe(s): Secondary | ICD-10-CM | POA: Diagnosis not present

## 2020-08-29 DIAGNOSIS — M79674 Pain in right toe(s): Secondary | ICD-10-CM

## 2020-08-31 NOTE — Progress Notes (Signed)
Subjective: [name] is a pleasant 87 y.o. male patient seen today painful thick toenails that are difficult to trim. Pain interferes with ambulation. Aggravating factors include wearing enclosed shoe gear. Pain is relieved with periodic professional debridement.  He has h/o RA. He voices no new pedal problems on today's visit.  PCP is Ngetich, Donalee Citrin, NP. Last visit was: July 07, 2020.  No Known Allergies  Objective: Physical Exam  General: [name] is a pleasant 87 y.o. Caucasian male, frail, in NAD. AAO x 3.   Vascular:  Capillary refill time to digits immediate b/l. Palpable pedal pulses b/l LE. Pedal hair absent. Lower extremity skin temperature gradient within normal limits. No pain with calf compression b/l. No edema noted b/l lower extremities.  Dermatological:  Pedal skin is thin shiny, atrophic b/l lower extremities. No open wounds bilaterally. No interdigital macerations bilaterally. Toenails 1-5 b/l elongated, discolored, dystrophic, thickened, crumbly with subungual debris and tenderness to dorsal palpation. No hyperkeratotic nor porokeratotic lesions present on today's visit.  Musculoskeletal:  Normal muscle strength 5/5 to all lower extremity muscle groups bilaterally. Fibular deviation of toes 2-5 b/l consistent with RA. No pain crepitus or joint limitation noted with ROM b/l. Plantarflexed metatarsal(s) b/l lower extremities. Hallux valgus with bunion deformity noted b/l lower extremities. Utilizes rollator for ambulation assistance.  Neurological:  Protective sensation intact 5/5 intact bilaterally with 10g monofilament b/l. Vibratory sensation intact b/l.  Assessment and Plan:  1. Pain due to onychomycosis of toenails of both feet      -No new findings. No new orders. -Patient to continue soft, supportive shoe gear daily. -Toenails 1-5 b/l were debrided in length and girth with sterile nail nippers and dremel without iatrogenic bleeding.  -Patient  to report any pedal injuries to medical professional immediately. -Patient/POA to call should there be question/concern in the interim.  Return in about 3 months (around 11/29/2020).  Freddie Breech, DPM

## 2020-12-05 ENCOUNTER — Ambulatory Visit (INDEPENDENT_AMBULATORY_CARE_PROVIDER_SITE_OTHER): Payer: Federal, State, Local not specified - PPO | Admitting: Podiatry

## 2020-12-05 ENCOUNTER — Other Ambulatory Visit: Payer: Self-pay

## 2020-12-05 ENCOUNTER — Encounter: Payer: Self-pay | Admitting: Podiatry

## 2020-12-05 DIAGNOSIS — D500 Iron deficiency anemia secondary to blood loss (chronic): Secondary | ICD-10-CM | POA: Diagnosis not present

## 2020-12-05 DIAGNOSIS — M0579 Rheumatoid arthritis with rheumatoid factor of multiple sites without organ or systems involvement: Secondary | ICD-10-CM | POA: Diagnosis not present

## 2020-12-05 DIAGNOSIS — L84 Corns and callosities: Secondary | ICD-10-CM

## 2020-12-05 DIAGNOSIS — M79674 Pain in right toe(s): Secondary | ICD-10-CM

## 2020-12-05 DIAGNOSIS — B351 Tinea unguium: Secondary | ICD-10-CM | POA: Diagnosis not present

## 2020-12-05 DIAGNOSIS — M79675 Pain in left toe(s): Secondary | ICD-10-CM | POA: Diagnosis not present

## 2020-12-11 NOTE — Progress Notes (Signed)
  Subjective:  Patient ID: [name], male    DOB: [birth year],  MRN: [id number]  [name] presents to clinic today for painful elongated mycotic toenails 1-5 bilaterally which are tender when wearing enclosed shoe gear. Pain is relieved with periodic professional debridement. and callus(es) right foot. Aggravating factors include weightbearing with and without shoe gear. Pain is relieved with periodic professional debridement.  He has h/o RA and takes Ibuprofen. Not on immunosuppressive therapy at this time.  PCP is Ngetich, Dinah C, NP , and last visit was 07/07/2020.  No Known Allergies  Review of Systems: Negative except as noted in the HPI. Objective:   Constitutional [name] is a pleasant 87 y.o. Caucasian male, thin build in NAD. AAO x 3.   Vascular Capillary refill time to digits immediate b/l. Palpable DP pulse(s) b/l LE. Palpable PT pulse(s) b/l LE. Pedal hair absent. No pain with calf compression RLE. No edema noted b/l LE. No cyanosis or clubbing noted b/l LE.  Neurologic Normal speech. Oriented to person, place, and time. Protective sensation intact 5/5 intact bilaterally with 10g monofilament b/l. Vibratory sensation intact b/l.  Dermatologic Pedal skin thin, shiny and atrophic b/l LE. No open wounds b/l LE. No interdigital macerations noted b/l LE. Hyperkeratotic lesion(s) plantar heel pad of right foot.  No erythema, no edema, no drainage, no fluctuance. Porokeratotic lesion(s) submet head 1 right foot. No erythema, no edema, no drainage, no fluctuance.  Orthopedic: Muscle strength 5/5 to all lower extremity muscle groups bilaterally. Plantarflexed metatarsal(s) 2-5 bilaterally. HAV with bunion deformity noted b/l LE. Fibular deviation of digits 2-5 bilaterally. Plantar fat pad atrophy of forefoot area b/l lower extremities. Wearing appropriate fitting shoe gear. Utilizes rollator for ambulation assistance.   Radiographs: None   Assessment:   1. Pain due  to onychomycosis of toenails of both feet   2. Callus   3. Rheumatoid arthritis involving multiple sites with positive rheumatoid factor (HCC)    Plan:  Patient was evaluated and treated and all questions answered. Consent given for treatment as described below: -Examined patient. -Patient to continue soft, supportive shoe gear daily. -Mycotic toenails 1-5 bilaterally were debrided in length and girth with sterile nail nippers and dremel without incident. -Callus(es) plantar heel pad of right foot pared utilizing sterile scalpel Mccann without complication or incident. Total number debrided =1. -Painful porokeratotic lesion(s) submet head 1 right foot pared and enucleated with sterile scalpel Mccann without incident. Total number of lesions debrided=1. -Patient/POA to call should there be question/concern in the interim.  Return in about 3 months (around 03/05/2021).  [name], DPM

## 2021-01-12 ENCOUNTER — Other Ambulatory Visit: Payer: Federal, State, Local not specified - PPO

## 2021-01-12 ENCOUNTER — Ambulatory Visit: Payer: Federal, State, Local not specified - PPO | Admitting: Family

## 2021-01-13 ENCOUNTER — Encounter: Payer: Self-pay | Admitting: Family

## 2021-01-13 ENCOUNTER — Other Ambulatory Visit: Payer: Federal, State, Local not specified - PPO

## 2021-01-13 ENCOUNTER — Other Ambulatory Visit: Payer: Self-pay

## 2021-01-13 ENCOUNTER — Ambulatory Visit (INDEPENDENT_AMBULATORY_CARE_PROVIDER_SITE_OTHER): Payer: Federal, State, Local not specified - PPO | Admitting: Family

## 2021-01-13 VITALS — BP 110/80 | HR 52 | Temp 97.3°F | Resp 16 | Ht 67.0 in | Wt 122.0 lb

## 2021-01-13 DIAGNOSIS — Z23 Encounter for immunization: Secondary | ICD-10-CM

## 2021-01-13 DIAGNOSIS — M0579 Rheumatoid arthritis with rheumatoid factor of multiple sites without organ or systems involvement: Secondary | ICD-10-CM

## 2021-01-13 DIAGNOSIS — R7989 Other specified abnormal findings of blood chemistry: Secondary | ICD-10-CM | POA: Diagnosis not present

## 2021-01-13 NOTE — Progress Notes (Signed)
Provider: Marlowe Sax FNP-C   Erykah Lippert, Nelda Bucks, NP  Patient Care Team: Harshini Trent, Nelda Bucks, NP as PCP - General (Family Medicine)  Extended Emergency Contact Information Primary Emergency Contact: Kavan,Valerie Address: [address]          [place] [address] [place] Phone: [phone] Mobile Phone: [phone] Relation: Spouse  Code Status:  Full Code  Goals of care: Advanced Directive information Advanced Directives 01/13/2021  Does Patient Have a Medical Advance Directive? No  Type of Advance Directive -  Copy of [place] in Chart? -  Would patient like information on creating a medical advance directive? No - Patient declined     Chief Complaint  Patient presents with   Medical Management of Chronic Issues    6 month follow up   Immunizations    Discuss the need for Influenza vaccine.    HPI:  Pt is a 88 y.o. male seen today for 6 months for medical management of chronic diseases. He is here with wife.States has required assistance with his ADL's due to Rheumatoid Arthritis on hands and pain on the knees.He takes ibuprofen when he really needs it.she tried in home personal assistance but even with insurance he still had to pay 25 dollars for 3 hrs which was expensive.It's getting more difficult for wife to get him in and out of the house.Inquires if there any doctors who come home.Discussed option for doctor's making House call.  No recent falls or recent hospital admission  Appetite is good.  Past Medical History:  Diagnosis Date   Rheumatoid arthritis involving multiple joints ([place])    Ruptured appendix    Ventral hernia    Past Surgical History:  Procedure Laterality Date   APPENDECTOMY  1978   had peritonitis (ruptured), had exlap   HAND SURGERY  2005, 2010   multiple hand joint replacements Dr. Amedeo Plenty   INGUINAL HERNIA REPAIR Left 08/04/2018   Procedure: OPEN LEFT INGUINAL HERNIA REPAIR WITH MESH;  Surgeon:  Armandina Gemma, MD;  Location: [facility];  Service: General;  Laterality: Left;   left hip replacement     Dr. Alvan Dame   SHOULDER SURGERY Right 1982   SHOULDER SURGERY Left 1992    Dr. Lorre Nick    No Known Allergies  Allergies as of 01/13/2021   No Known Allergies      Medication List        Accurate as of January 13, 2021  9:51 AM. If you have any questions, ask your nurse or doctor.          ibuprofen 200 MG tablet Commonly known as: ADVIL Take 400-600 mg by mouth every 8 (eight) hours as needed (pain.). Reported on 04/28/2015        Review of Systems  Constitutional:  Negative for appetite change, chills, fatigue, fever and unexpected weight change.  HENT:  Negative for congestion, dental problem, ear discharge, ear pain, facial swelling, hearing loss, nosebleeds, postnasal drip, rhinorrhea, sinus pressure, sinus pain, sneezing, sore throat, tinnitus and trouble swallowing.   Eyes:  Negative for pain, discharge, redness, itching and visual disturbance.  Respiratory:  Negative for cough, chest tightness, shortness of breath and wheezing.   Cardiovascular:  Negative for chest pain, palpitations and leg swelling.  Gastrointestinal:  Negative for abdominal distention, abdominal pain, blood in stool, constipation, diarrhea, nausea and vomiting.  Endocrine: Negative for cold intolerance, heat intolerance, polydipsia, polyphagia and polyuria.  Genitourinary:  Negative for difficulty urinating,  dysuria, flank pain, frequency and urgency.  Musculoskeletal:  Positive for arthralgias and gait problem. Negative for back pain, joint swelling, myalgias, neck pain and neck stiffness.  Skin:  Negative for color change, pallor, rash and wound.  Neurological:  Negative for dizziness, syncope, speech difficulty, weakness, light-headedness, numbness and headaches.  Hematological:  Does not bruise/bleed easily.  Psychiatric/Behavioral:  Negative for agitation, behavioral problems, confusion,  hallucinations, self-injury, sleep disturbance and suicidal ideas. The patient is not nervous/anxious.    Immunization History  Administered Date(s) Administered   Fluad Quad(high Dose 65+) 01/07/2019, 01/08/2020, 01/13/2021   Influenza,inj,Quad PF,6+ Mos 10/18/2014   Influenza-Unspecified 10/01/2013   PFIZER(Purple Top)SARS-COV-2 Vaccination 02/12/2019, 03/07/2019, 04/08/2019, 11/01/2019, 07/18/2020   Pneumococcal Conjugate-13 04/15/2014   Pneumococcal Polysaccharide-23 04/11/2015   Tdap 01/08/2020   Zoster Recombinat (Shingrix) 07/18/2020, 12/19/2020   Pertinent  Health Maintenance Due  Topic Date Due   INFLUENZA VACCINE  Completed   Fall Risk 01/08/2020 01/08/2020 01/12/2020 07/07/2020 01/13/2021  Falls in the past year? 1 - 1 1 0  Was there an injury with Fall? 1 - 1 1 0  Fall Risk Category Calculator 3 - 3 3 0  Fall Risk Category High - High High Low  Patient Fall Risk Level High fall risk High fall risk - High fall risk Low fall risk  Patient at Risk for Falls Due to - - - History of fall(s) No Fall Risks  Fall risk Follow up - - - Falls evaluation completed Falls evaluation completed   Functional Status Survey:    Vitals:   01/13/21 0918  BP: 110/80  Pulse: (!) 52  Resp: 16  Temp: (!) 97.3 F (36.3 C)  SpO2: 96%  Weight: 122 lb (55.3 kg)  Height: _0  (1.702 m)   Body mass index is 19.11 kg/m. Physical Exam Vitals reviewed.  Constitutional:      General: He is not in acute distress.    Appearance: Normal appearance. He is normal weight. He is not ill-appearing or diaphoretic.  HENT:     Head: Normocephalic.     Right Ear: Tympanic membrane, ear canal and external ear normal. There is no impacted cerumen.     Left Ear: Tympanic membrane, ear canal and external ear normal. There is no impacted cerumen.     Nose: Nose normal. No congestion or rhinorrhea.     Mouth/Throat:     Mouth: Mucous membranes are moist.     Pharynx: Oropharynx is clear. No oropharyngeal  exudate or posterior oropharyngeal erythema.  Eyes:     General: No scleral icterus.       Right eye: No discharge.        Left eye: No discharge.     Extraocular Movements: Extraocular movements intact.     Conjunctiva/sclera: Conjunctivae normal.     Pupils: Pupils are equal, round, and reactive to light.  Neck:     Vascular: No carotid bruit.  Cardiovascular:     Rate and Rhythm: Normal rate and regular rhythm.     Pulses: Normal pulses.     Heart sounds: Normal heart sounds. No murmur heard.   No friction rub. No gallop.  Pulmonary:     Effort: Pulmonary effort is normal. No respiratory distress.     Breath sounds: Normal breath sounds. No wheezing, rhonchi or rales.  Chest:     Chest wall: No tenderness.  Abdominal:     General: Bowel sounds are normal. There is no distension.     Palpations: Abdomen  is soft. There is no mass.     Tenderness: There is no abdominal tenderness. There is no right CVA tenderness, left CVA tenderness, guarding or rebound.  Musculoskeletal:        General: No swelling or tenderness.     Cervical back: Normal range of motion. No rigidity or tenderness.     Right lower leg: No edema.     Left lower leg: No edema.     Comments: Unsteady gait.arthritic changes to both hands   Lymphadenopathy:     Cervical: No cervical adenopathy.  Skin:    General: Skin is warm and dry.     Coloration: Skin is not pale.     Findings: No bruising, erythema, lesion or rash.  Neurological:     Mental Status: He is alert and oriented to person, place, and time.     Cranial Nerves: No cranial nerve deficit.     Sensory: No sensory deficit.     Motor: No weakness.     Coordination: Coordination normal.     Gait: Gait abnormal.  Psychiatric:        Mood and Affect: Mood normal.        Speech: Speech normal.        Behavior: Behavior normal.        Thought Content: Thought content normal.        Judgment: Judgment normal.    Labs reviewed: Recent Labs     07/05/20 0844  NA 139  K 4.1  CL 105  CO2 24  GLUCOSE 90  BUN 20  CREATININE 0.66*  CALCIUM 9.0   Recent Labs    07/05/20 0844  AST 15  ALT 11  BILITOT 0.4  PROT 7.2   Recent Labs    07/05/20 0844  WBC 7.3  NEUTROABS 4,752  HGB 12.2*  HCT 37.0*  MCV 87.9  PLT 215   Lab Results  Component Value Date   TSH 6.37 (H) 01/05/2020   Lab Results  Component Value Date   HGBA1C 5.7 (H) 04/11/2015   Lab Results  Component Value Date   CHOL 104 04/11/2015   HDL 36 (L) 04/11/2015   LDLCALC 54 04/11/2015   TRIG 71 04/11/2015   CHOLHDL 2.9 04/11/2015    Significant Diagnostic Results in last 30 days:  No results found.  Assessment/Plan 1. Need for influenza vaccination Afebrile Flut shot administered by CMA no acute reaction reported.  - Flu Vaccine QUAD High Dose(Fluad)  2. Rheumatoid arthritis involving multiple sites with positive rheumatoid factor ([place]) Seldom takes ibuprofen - continue with supportive care  - CBC with Differential/Platelet - CMP with eGFR([facility])  3. Abnormal TSH Lab Results  Component Value Date   TSH 6.37 (H) 01/05/2020   T 3 and T 4  checked are within normal range Will continue to monitor for now.  - TSH  Family/ staff Communication: Reviewed plan of care with patient and wife verbalized understanding.  Labs/tests ordered:  - CBC with Differential/Platelet - CMP with eGFR([facility]) - TSH  Next Appointment : 6 months for medical management of chronic issues with Fasting Labs prior to visit.  Sandrea Hughs, NP

## 2021-01-18 LAB — CBC WITH DIFFERENTIAL/PLATELET
Absolute Monocytes: 598 cells/uL (ref 200–950)
Basophils Absolute: 39 cells/uL (ref 0–200)
Basophils Relative: 0.6 %
Eosinophils Absolute: 286 cells/uL (ref 15–500)
Eosinophils Relative: 4.4 %
HCT: 38.6 % (ref 38.5–50.0)
Hemoglobin: 12.9 g/dL — ABNORMAL LOW (ref 13.2–17.1)
Lymphs Abs: 1573 cells/uL (ref 850–3900)
MCH: 29.6 pg (ref 27.0–33.0)
MCHC: 33.4 g/dL (ref 32.0–36.0)
MCV: 88.5 fL (ref 80.0–100.0)
MPV: 11.3 fL (ref 7.5–12.5)
Monocytes Relative: 9.2 %
Neutro Abs: 4004 cells/uL (ref 1500–7800)
Neutrophils Relative %: 61.6 %
Platelets: 219 10*3/uL (ref 140–400)
RBC: 4.36 10*6/uL (ref 4.20–5.80)
RDW: 13.1 % (ref 11.0–15.0)
Total Lymphocyte: 24.2 %
WBC: 6.5 10*3/uL (ref 3.8–10.8)

## 2021-01-18 LAB — COMPLETE METABOLIC PANEL WITH GFR
AG Ratio: 1.1 (calc) (ref 1.0–2.5)
ALT: 12 U/L (ref 9–46)
AST: 17 U/L (ref 10–35)
Albumin: 3.8 g/dL (ref 3.6–5.1)
Alkaline phosphatase (APISO): 72 U/L (ref 35–144)
BUN/Creatinine Ratio: 33 (calc) — ABNORMAL HIGH (ref 6–22)
BUN: 22 mg/dL (ref 7–25)
CO2: 28 mmol/L (ref 20–32)
Calcium: 9.6 mg/dL (ref 8.6–10.3)
Chloride: 104 mmol/L (ref 98–110)
Creat: 0.67 mg/dL — ABNORMAL LOW (ref 0.70–1.22)
Globulin: 3.6 g/dL (calc) (ref 1.9–3.7)
Glucose, Bld: 90 mg/dL (ref 65–99)
Potassium: 4.1 mmol/L (ref 3.5–5.3)
Sodium: 140 mmol/L (ref 135–146)
Total Bilirubin: 0.4 mg/dL (ref 0.2–1.2)
Total Protein: 7.4 g/dL (ref 6.1–8.1)
eGFR: 91 mL/min/{1.73_m2} (ref >=60)

## 2021-01-18 LAB — T4, FREE: Free T4: 1.1 ng/dL (ref 0.8–1.8)

## 2021-01-18 LAB — TEST AUTHORIZATION

## 2021-01-18 LAB — TSH: TSH: 5.46 mIU/L — ABNORMAL HIGH (ref 0.40–4.50)

## 2021-01-18 LAB — T3, FREE: T3, Free: 3.5 pg/mL (ref 2.3–4.2)

## 2021-03-01 IMAGING — CT CT CERVICAL SPINE W/O CM
3 of 4 series · 9 of 33 positions shown, 11 images · non-contrast
Comparison: None.

CLINICAL DATA: Neck trauma.  Trip and fall at home on blankets.

EXAM:
CT CERVICAL SPINE WITHOUT CONTRAST
TECHNIQUE: Multidetector CT imaging of the cervical spine was performed without
intravenous contrast. Multiplanar CT image reconstructions were also
generated.

[Series 6: orthogonal bone · axial · 0.21mm/px · z∈[-225,-225]mm · 1 of 109 slices shown, 2 images]
[im 62/109  soft-tissue]
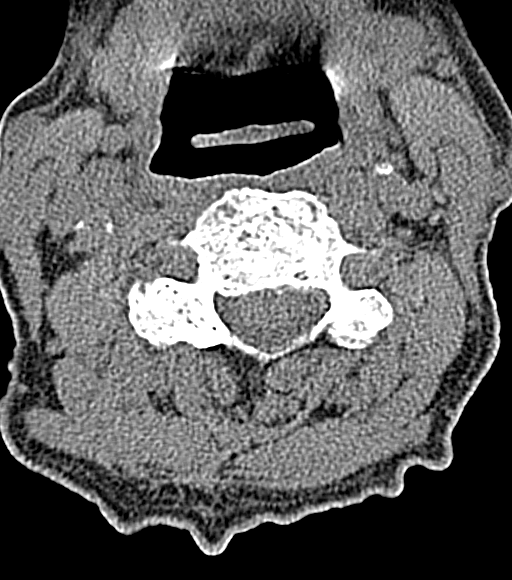
[im 62/109  bone]
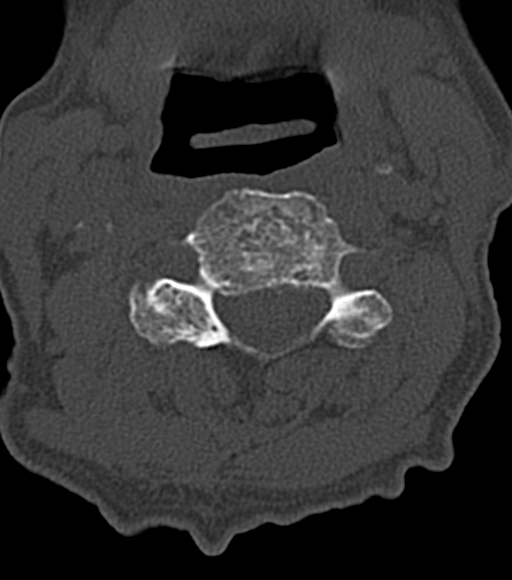

[Series 7: coronal bone · coronal · 0.21mm/px · 3 of 61 slices shown]
[im 13/61  bone]
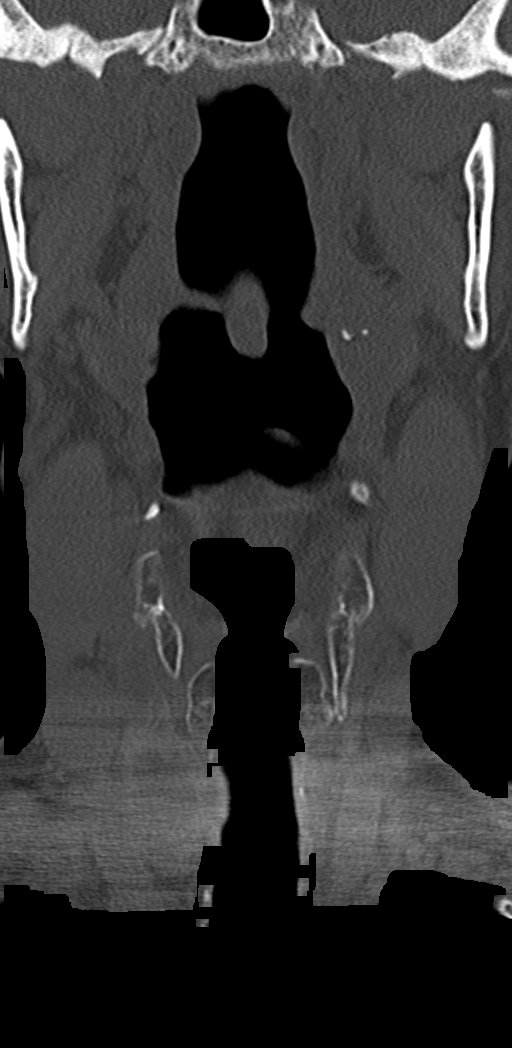
[im 25/61  bone]
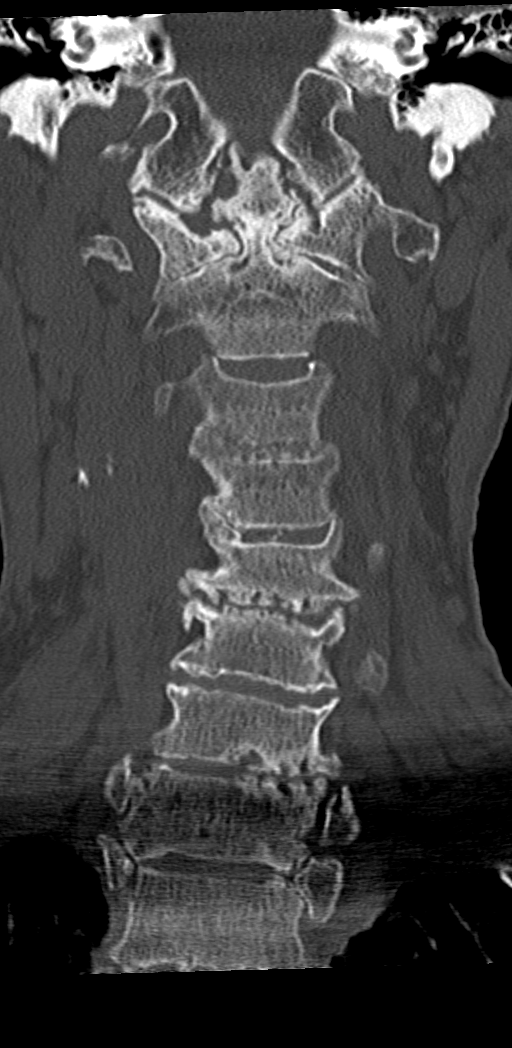
[im 37/61  bone]
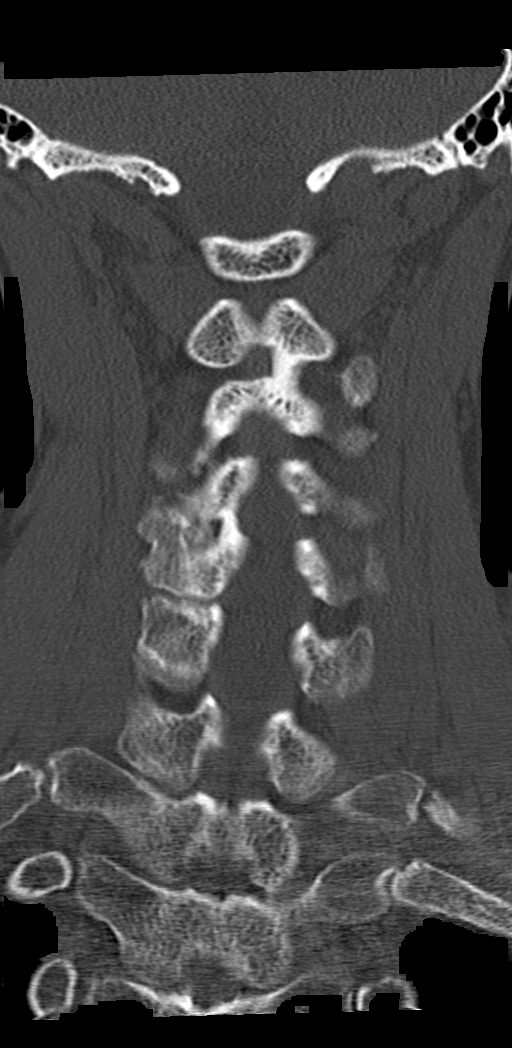

[Series 8: sagittal bone · sagittal · 0.23mm/px · 5 of 54 slices shown, 6 images]
[im 18/54  bone]
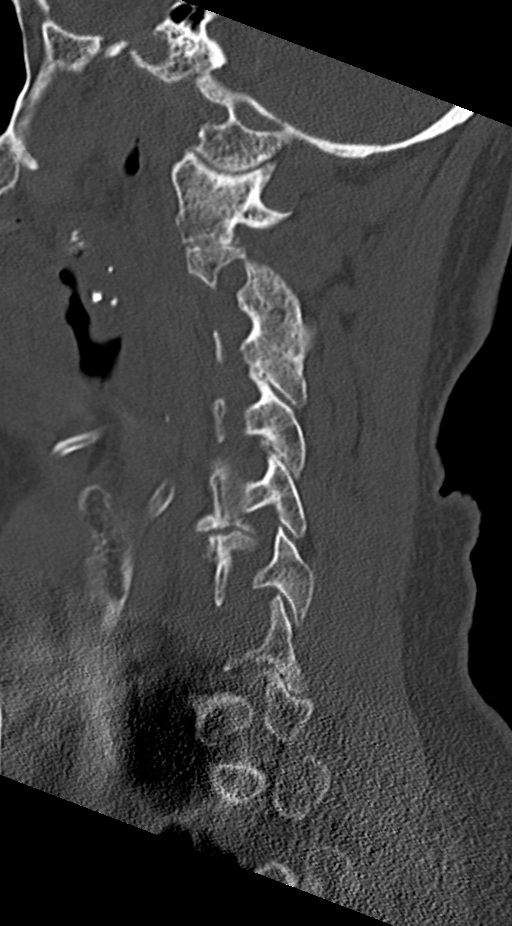
[im 23/54  bone]
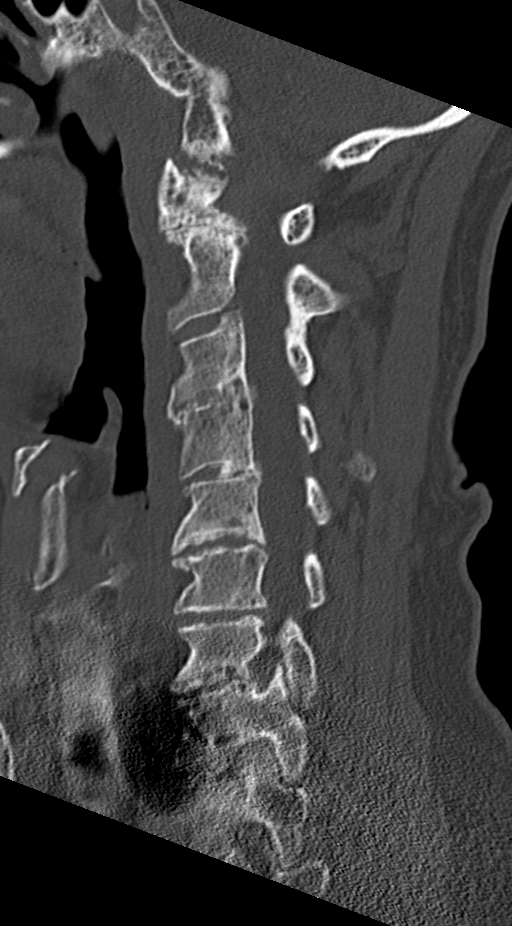
[im 27/54  soft-tissue]
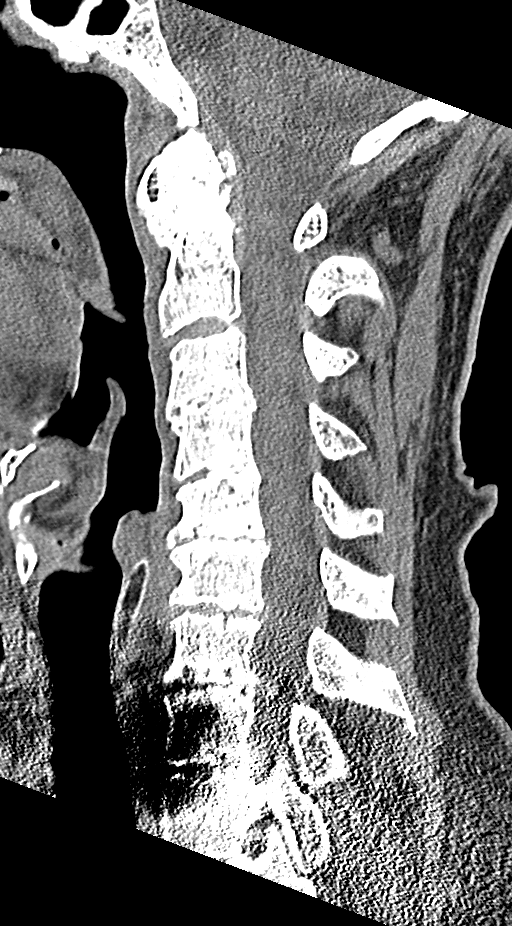
[im 27/54  bone]
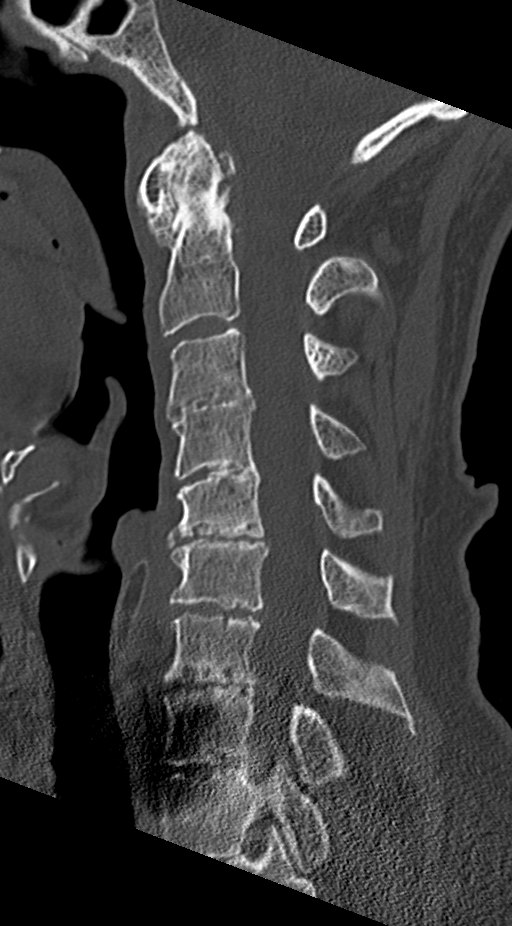
[im 31/54  bone]
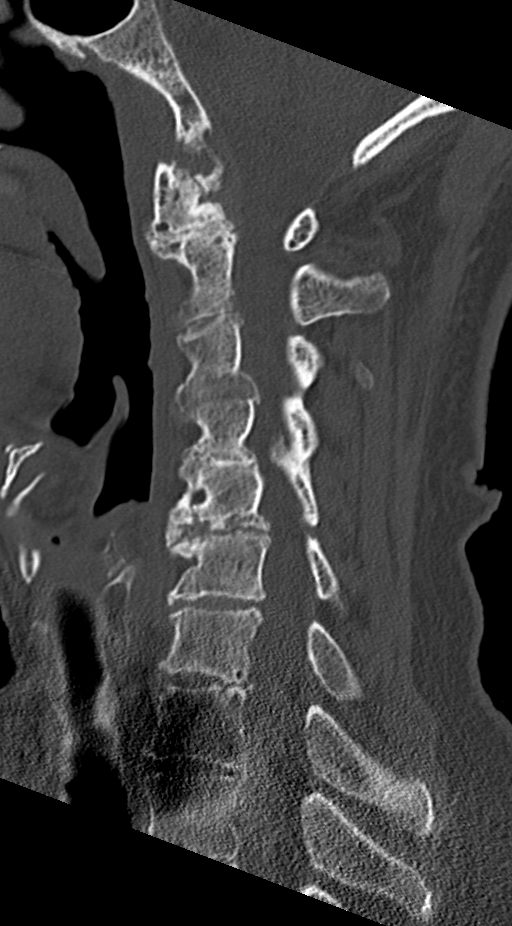
[im 36/54  bone]
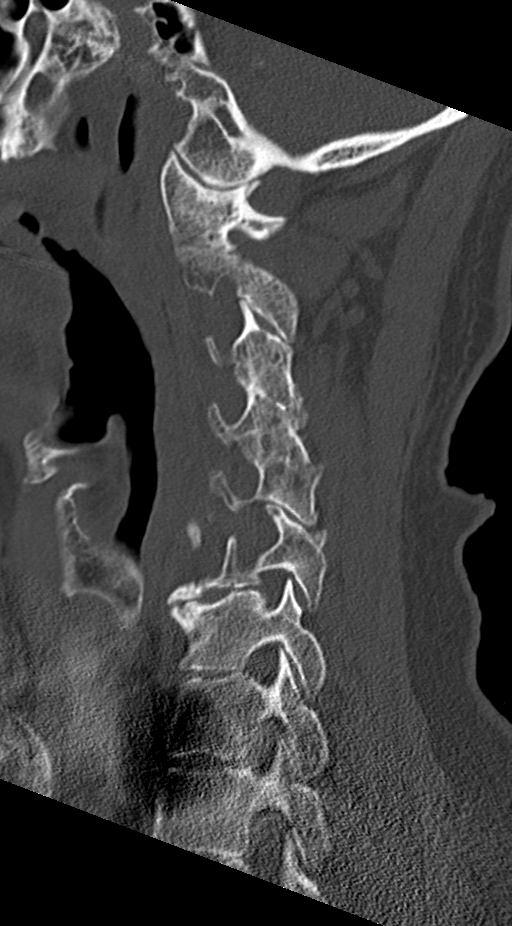

[9 of 33 positions shown; findings below may reference images not displayed]

FINDINGS: Alignment: Straightening of normal lordosis. Trace degenerative
anterolisthesis of C5 on C6. Broad-based levo scoliotic curvature.
No jumped or perched facets.

Skull base and vertebrae: No acute fracture. Vertebral body heights
are maintained. The dens and skull base are intact. Advanced
degenerative change at C1-C2.

Soft tissues and spinal canal: No prevertebral fluid or swelling. No
visible canal hematoma.

Disc levels: Diffuse disc space narrowing and endplate spurring.
Diffuse multilevel facet hypertrophy with areas of facet ankylosis,
C2-C3 on the left, C3-C4 and C4-C5 on the right.

Upper chest: Mild emphysema.  No acute findings.

Other: Carotid calcifications.
IMPRESSION: 1. No acute fracture or subluxation of the cervical spine.
2. Multilevel degenerative disc disease and facet hypertrophy.
3. Straightening of normal lordosis may be positioning or muscle
spasm.

## 2021-03-01 IMAGING — CT CT HEAD W/O CM
3 series · 16 of 47 positions shown, 19 images · non-contrast
Comparison: Head CT 05/28/2019

CLINICAL DATA: Trip and fall at home on blankets striking head on
floor on wall. No loss of consciousness.

EXAM:
CT HEAD WITHOUT CONTRAST
TECHNIQUE: Contiguous axial images were obtained from the base of the skull
through the vertex without intravenous contrast.

[Series 3: head wo · axial · 0.44mm/px · z∈[-125,+5]mm · 10 of 32 slices shown, 13 images]
[im 3/32  brain]
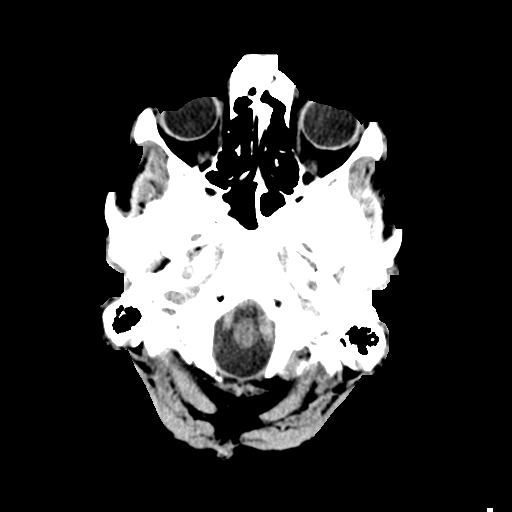
[im 3/32  bone]
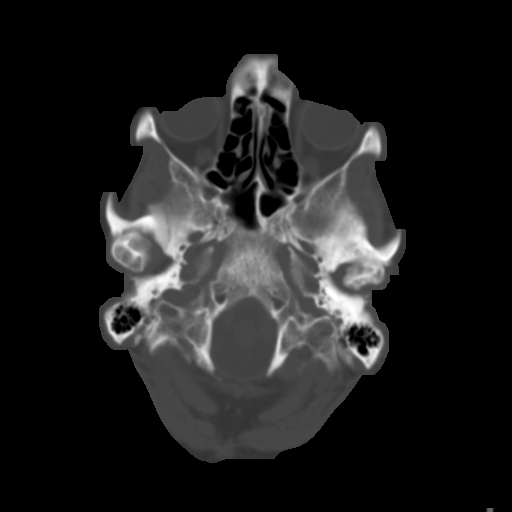
[im 6/32  brain]
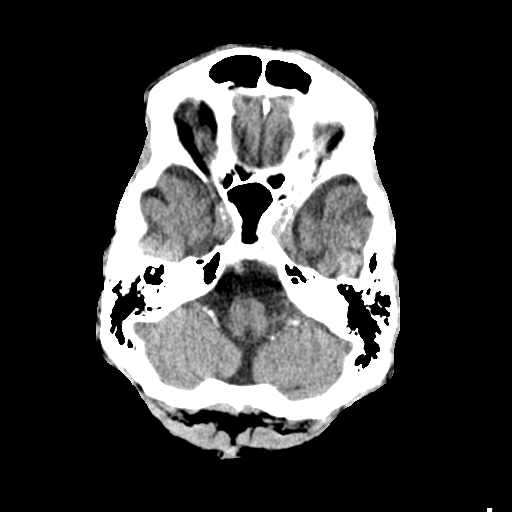
[im 9/32  brain]
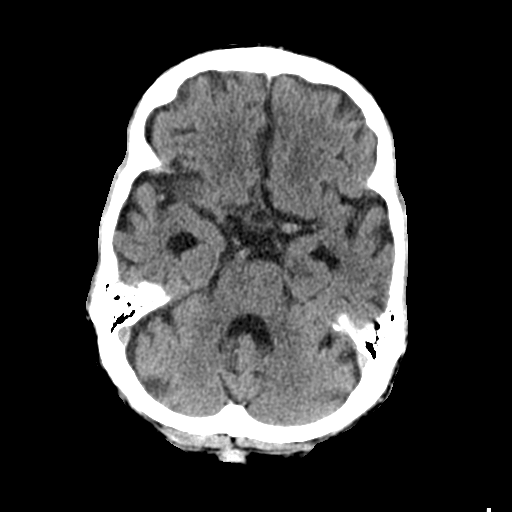
[im 11/32  brain]
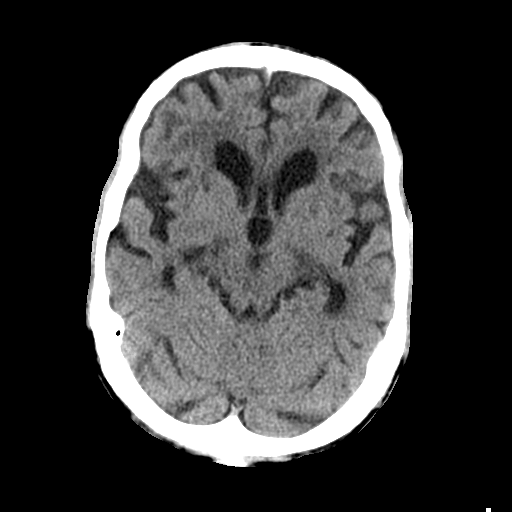
[im 14/32  brain]
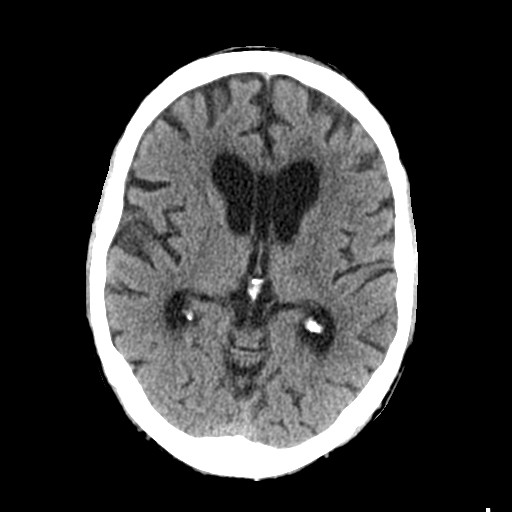
[im 14/32  bone]
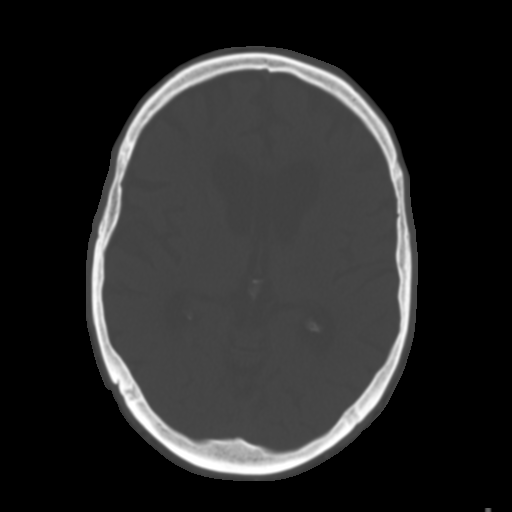
[im 18/32  brain]
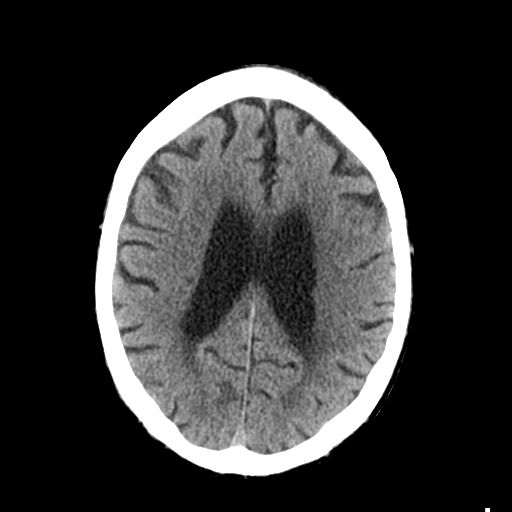
[im 21/32  brain]
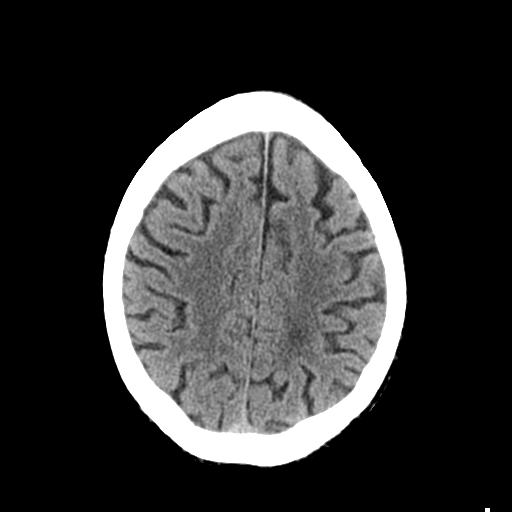
[im 24/32  brain]
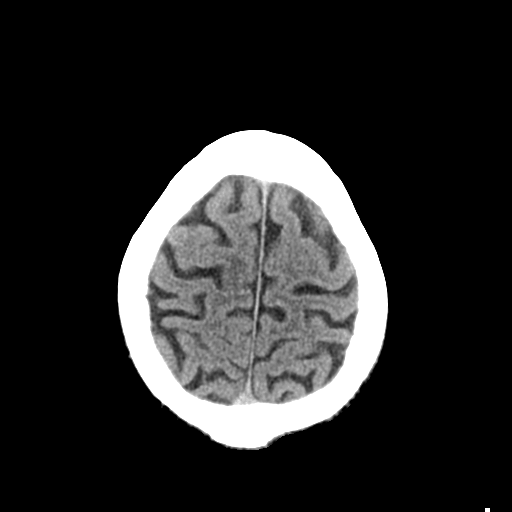
[im 26/32  brain]
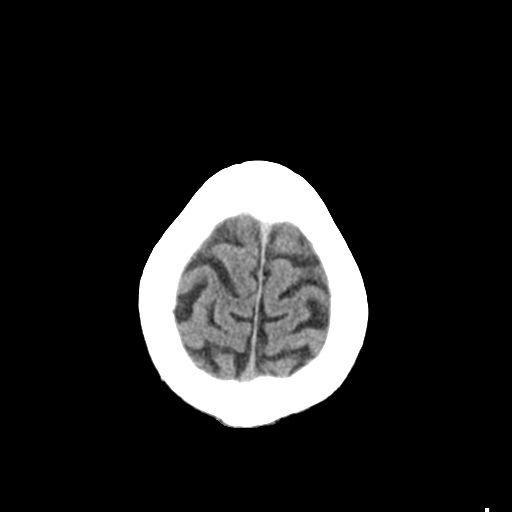
[im 26/32  bone]
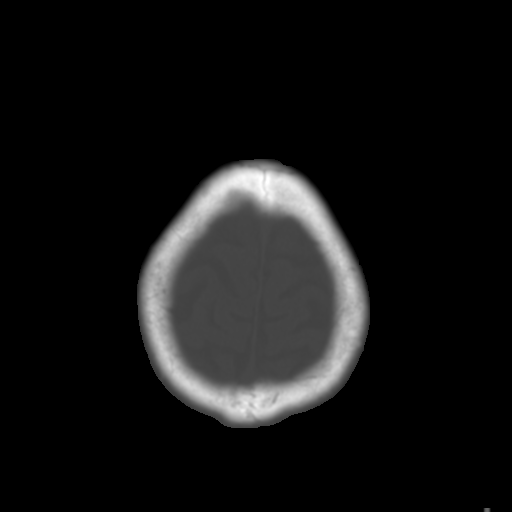
[im 29/32  brain]
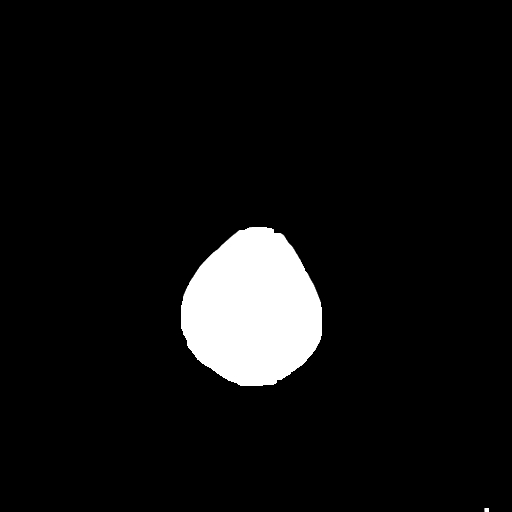

[Series 6: coronal soft tissue · coronal · 0.31mm/px · 3 of 67 slices shown]
[im 23/67  brain]
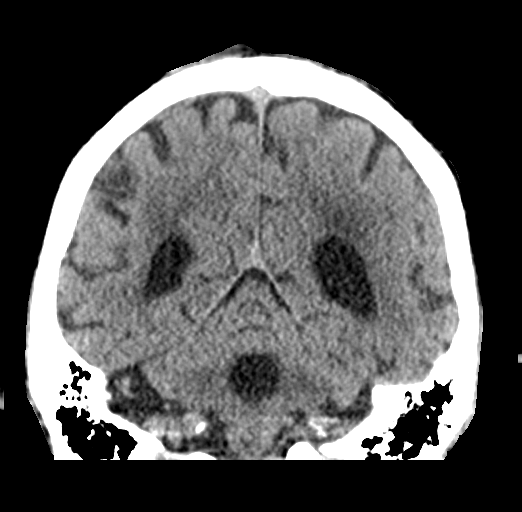
[im 30/67  brain]
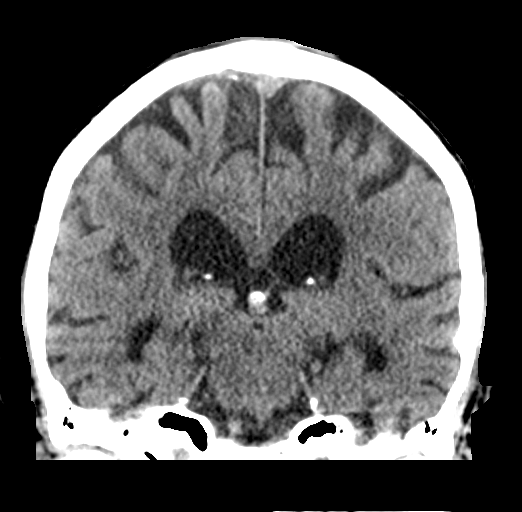
[im 37/67  brain]
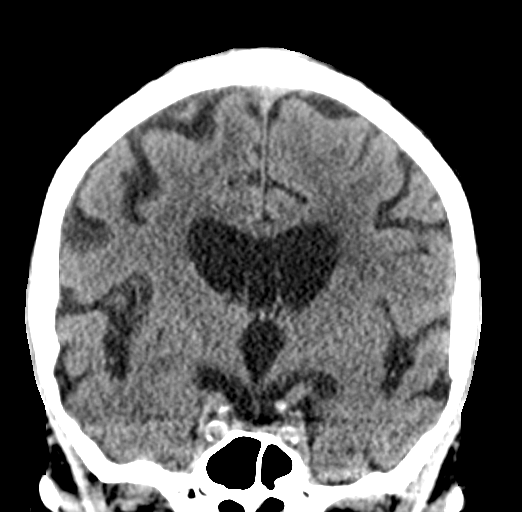

[Series 7: sagittal soft tissue · sagittal · 0.31mm/px · 3 of 54 slices shown]
[im 18/54  brain]
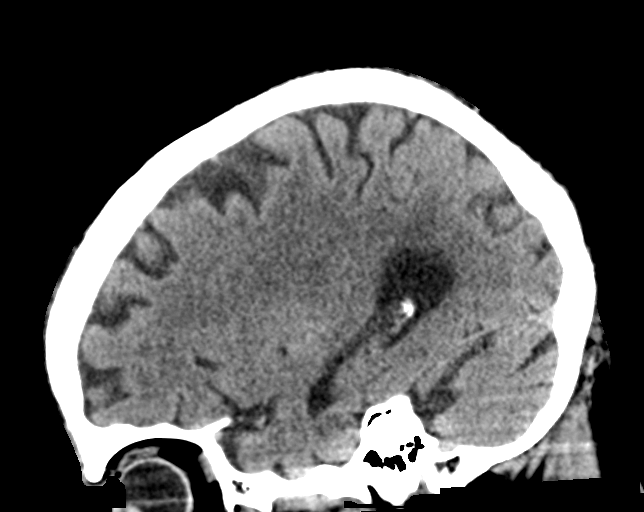
[im 27/54  brain]
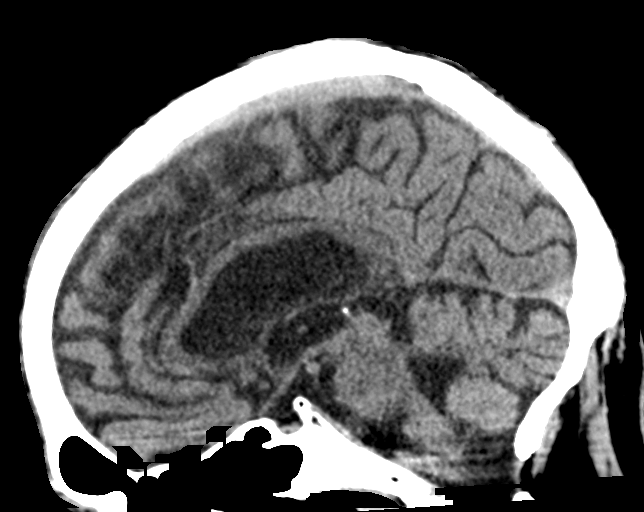
[im 36/54  brain]
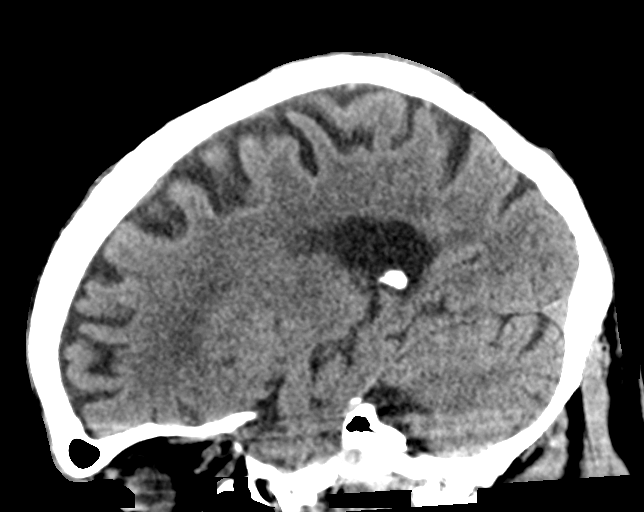

[16 of 47 positions shown; findings below may reference images not displayed]

FINDINGS: Brain: Stable degree of atrophy and chronic small vessel ischemia.
No intracranial hemorrhage, mass effect, or midline shift. No
hydrocephalus. The basilar cisterns are patent. No evidence of
territorial infarct or acute ischemia. No extra-axial or
intracranial fluid collection.

Vascular: Atherosclerosis of skullbase vasculature without
hyperdense vessel or abnormal calcification.

Skull: No fracture or focal lesion.

Sinuses/Orbits: Minor mucosal thickening of ethmoid air cells. No
acute fracture or sinus fluid level. The mastoid air cells are
clear.

Other: Midline frontal and parietal scalp hematoma.
IMPRESSION: 1. Midline frontal and parietal scalp hematoma. No acute
intracranial abnormality. No skull fracture.
2. Stable atrophy and chronic small vessel ischemia.

## 2021-03-13 ENCOUNTER — Encounter: Payer: Self-pay | Admitting: Podiatry

## 2021-03-13 ENCOUNTER — Ambulatory Visit: Payer: Federal, State, Local not specified - PPO | Admitting: Podiatry

## 2021-03-13 DIAGNOSIS — M79674 Pain in right toe(s): Secondary | ICD-10-CM

## 2021-03-13 DIAGNOSIS — M79675 Pain in left toe(s): Secondary | ICD-10-CM | POA: Diagnosis not present

## 2021-03-13 DIAGNOSIS — B351 Tinea unguium: Secondary | ICD-10-CM | POA: Diagnosis not present

## 2021-03-19 NOTE — Progress Notes (Signed)
?  Subjective:  ?Patient ID: [name], male    DOB: November 27, 1932,  MRN: [id number] ? ?[name] presents to clinic today for painful thick toenails that are difficult to trim. Pain interferes with ambulation. Aggravating factors include wearing enclosed shoe gear. Pain is relieved with periodic professional debridement. ? ?New problem(s): None.  ? ?PCP is Ngetich, Donalee Citrin, NP , and last visit was January 13, 2021. ? ?No Known Allergies ? ?Review of Systems: Negative except as noted in the HPI. ? ?Objective: No changes noted in today's physical examination. ?Constitutional [name] is a pleasant 88 y.o. Caucasian male, thin build in NAD. AAO x 3.   ?Vascular Capillary refill time to digits immediate b/l. Palpable DP pulse(s) b/l LE. Palpable PT pulse(s) b/l LE. Pedal hair absent. No pain with calf compression RLE. No edema noted b/l LE. No cyanosis or clubbing noted b/l LE.  ?Neurologic Normal speech. Oriented to person, place, and time. Protective sensation intact 5/5 intact bilaterally with 10g monofilament b/l. Vibratory sensation intact b/l.  ?Dermatologic Pedal skin thin, shiny and atrophic b/l LE. No open wounds b/l LE. No interdigital macerations noted b/l LE. Hyperkeratotic lesion(s) plantar heel pad of right foot.  No erythema, no edema, no drainage, no fluctuance.  ?Orthopedic: Muscle strength 5/5 to all lower extremity muscle groups bilaterally. Plantarflexed metatarsal(s) 2-5 bilaterally. HAV with bunion deformity noted b/l LE. Fibular deviation of digits 2-5 bilaterally. Plantar fat pad atrophy of forefoot area b/l lower extremities. Wearing appropriate fitting shoe gear. Sitting in facility wheelchair on today's visit.  ? ?Radiographs: None ? ?Assessment/Plan: ?1. Pain due to onychomycosis of toenails of both feet   ?  ?-Toenails 1-5 b/l were debrided in length and girth with sterile nail nippers and dremel without iatrogenic bleeding.  ?-Patient/POA to call should there be  question/concern in the interim.  ?Return in about 3 months (around 06/13/2021). ? ?Freddie Breech, DPM  ?

## 2021-05-17 ENCOUNTER — Telehealth: Payer: Self-pay | Admitting: *Deleted

## 2021-05-17 NOTE — Telephone Encounter (Signed)
FL2 Paperwork dropped off at office for [name] to review, fill out and sign. Patient is looking for placement at [facility] [phone]  Fax: [phone] ? ?Placed form in [name]'s folder to fill out and sign. To be faxed to [facility] once completed.  ? ?Paid Careers adviser.  ?

## 2021-06-02 ENCOUNTER — Telehealth: Payer: Self-pay | Admitting: *Deleted

## 2021-06-02 NOTE — Telephone Encounter (Signed)
Paperwork dropped off for [name] to fill out.  [facility] paperwork for Examination for Housebound status or Permanent Need for Regular Aid and Attendance.   To Call [phone] once completed to pick up.   Placed form in [name]'s folder to review, fill out and sign.

## 2021-06-05 NOTE — Telephone Encounter (Signed)
Paperwork Completed by Carilyn Goodpastureinah.  Called Mrs Moise BoringLepko [phone] and [facility] call regarding [facility] Paperwork completed and ready for pick up.   Left [facility] Paperwork upfront in drawer under "L" for Pick up.  Copy sent to scanning.

## 2021-06-06 ENCOUNTER — Telehealth: Payer: Self-pay | Admitting: *Deleted

## 2021-06-06 NOTE — Telephone Encounter (Signed)
Adding fiber to diet or bene fiber by mouth daily.

## 2021-06-06 NOTE — Telephone Encounter (Signed)
Patient wife called and stated that patient has had Mushy Bowel Movements for the past Week. No other symptoms noted.   Wife is wanting to know what to feed him to make them solid again. Stated that it is getting hard to clean them up.   Please Advise.

## 2021-06-06 NOTE — Telephone Encounter (Signed)
Patient wife notified and agreed and will pick up.

## 2021-06-06 NOTE — Telephone Encounter (Signed)
Patient wife notified and agreed.  

## 2021-06-20 ENCOUNTER — Ambulatory Visit (INDEPENDENT_AMBULATORY_CARE_PROVIDER_SITE_OTHER): Payer: Federal, State, Local not specified - PPO | Admitting: Podiatry

## 2021-06-20 ENCOUNTER — Encounter: Payer: Self-pay | Admitting: Podiatry

## 2021-06-20 DIAGNOSIS — B351 Tinea unguium: Secondary | ICD-10-CM | POA: Diagnosis not present

## 2021-06-20 DIAGNOSIS — M0579 Rheumatoid arthritis with rheumatoid factor of multiple sites without organ or systems involvement: Secondary | ICD-10-CM

## 2021-06-20 DIAGNOSIS — M79674 Pain in right toe(s): Secondary | ICD-10-CM | POA: Diagnosis not present

## 2021-06-20 DIAGNOSIS — M79675 Pain in left toe(s): Secondary | ICD-10-CM

## 2021-07-19 ENCOUNTER — Encounter: Payer: Self-pay | Admitting: Family

## 2021-07-21 ENCOUNTER — Encounter: Payer: Federal, State, Local not specified - PPO | Admitting: Family

## 2021-07-23 NOTE — Progress Notes (Signed)
  This encounter was created in error - please disregard. No show 

## 2021-08-30 ENCOUNTER — Telehealth: Payer: Self-pay

## 2021-08-30 NOTE — Telephone Encounter (Signed)
Recommend the Physician at the facility to complete [facility] 2 forms to get up to date medical information for transfer.

## 2021-08-30 NOTE — Telephone Encounter (Signed)
Called and spoke with wife, she stated that they were able to use the FL2 that was done in January and the facility use doctors making house calls and they were able to see a doctor through that. The facility was in the middle of transition and the doctor just hadn't had a chance to see patient. She states that it is ok they will be using doctors making house calls. The new facility([facility]) accepted the past FL2.  Routed back to Richarda Blade, NP (FYI)

## 2021-08-30 NOTE — Telephone Encounter (Signed)
Patient's spouse called stating patient is currently residing at [facility]  and has not see a physician one time and has now decided to move to [facility]. Mrs.Agredano is asking if Carilyn Goodpasture will consent to signing FL2 form for patient to move. Mrs.Heizer emphasized that form is already filled out and only requires a signature from a provider that has seen him within the last 12 months (seen Dinah in January 2023).   Please advise if you will consent to signing, if so Mrs.Prest will send form over

## 2021-08-30 NOTE — Telephone Encounter (Signed)
Patient's wife called back and she stated that they do not have a physician at the facility. They failed to get him a doctor. Patient states that FL2 form is already filled out and the new facility is fine with his last visit date. She would like for you to just look over it,if you agree and sign it. She says this is urgent.  Message routed to Richarda Blade, NP

## 2021-08-30 NOTE — Telephone Encounter (Signed)
Noted  

## 2021-08-30 NOTE — Telephone Encounter (Signed)
Okay 

## 2021-09-26 ENCOUNTER — Ambulatory Visit (INDEPENDENT_AMBULATORY_CARE_PROVIDER_SITE_OTHER): Payer: Federal, State, Local not specified - PPO | Admitting: Podiatry

## 2021-09-26 DIAGNOSIS — Z91199 Patient's noncompliance with other medical treatment and regimen due to unspecified reason: Secondary | ICD-10-CM

## 2021-09-26 NOTE — Progress Notes (Signed)
1. No-show for appointment     

## 2023-01-08 ENCOUNTER — Ambulatory Visit: Payer: Federal, State, Local not specified - PPO | Admitting: Podiatry

## 2023-01-10 ENCOUNTER — Ambulatory Visit: Payer: Federal, State, Local not specified - PPO | Admitting: Podiatry

## 2023-01-10 ENCOUNTER — Encounter: Payer: Self-pay | Admitting: Podiatry

## 2023-01-10 DIAGNOSIS — M79674 Pain in right toe(s): Secondary | ICD-10-CM

## 2023-01-10 DIAGNOSIS — M0579 Rheumatoid arthritis with rheumatoid factor of multiple sites without organ or systems involvement: Secondary | ICD-10-CM | POA: Diagnosis not present

## 2023-01-10 DIAGNOSIS — S91001A Unspecified open wound, right ankle, initial encounter: Secondary | ICD-10-CM | POA: Diagnosis not present

## 2023-01-10 DIAGNOSIS — S91002A Unspecified open wound, left ankle, initial encounter: Secondary | ICD-10-CM

## 2023-01-10 DIAGNOSIS — M79675 Pain in left toe(s): Secondary | ICD-10-CM

## 2023-01-10 DIAGNOSIS — B351 Tinea unguium: Secondary | ICD-10-CM

## 2023-01-10 NOTE — Patient Instructions (Signed)
 The wounds on your ankles are coming from excessive pressure.  I expect that they will be able to heal on their own without a problem with appropriate offloading.  Keep pillows under your legs and ankles to prop them up and to protect him from further ulceration skin breakdown  They also look for Prevalon offloading heel boots at [facility] or on [employer].  If you do not notice improvement to your ankle wounds and they become more painful, red, swollen have drainage or appear infected contact office immediately

## 2023-01-10 NOTE — Progress Notes (Signed)
  Subjective:  Patient ID: [name], male    DOB: 15-Dec-1932,  MRN: [id number]  89 y.o. male presents painful thick toenails that are difficult to trim. Pain interferes with ambulation. Aggravating factors include wearing enclosed shoe gear. Pain is relieved with periodic professional debridement.  It has been over a year and a half since the patient has been seen in this office for professional footcare.  He also presents with sores on the ankles, 1 is present on the outside of the right ankle which is very painful, states that the spot on the left ankle is nonpainful.   No Known Allergies  Review of Systems: Negative except as noted in the HPI.   Objective:  Vascular Examination: CFT <3 seconds b/l LE. Palpable pedal pulses b/l LE. Pedal hair absent. No pain with calf compression b/l. Lower extremity skin temperature gradient within normal limits. No edema noted b/l LE. No ischemia or gangrene noted b/l LE. No cyanosis or clubbing noted b/l LE.  Neurological Examination: Sensation grossly intact b/l with 10 gram monofilament. Vibratory sensation intact b/l.   Dermatological Examination: Pedal skin thin, shiny and atrophic b/l LE.  No open wounds b/l. No interdigital macerations b/l. Toenails 1-5 b/l elongated, thickened, discolored with subungual debris. +Tenderness with dorsal palpation of nailplates.  Pedal skin is dry, flaky and xerotic.  Lateral right ankle there is tender lesion limited to breakdown of skin 0.3 cm in diameter with no surrounding erythema, no edema, no focal warmth increased correlating with a prominence over the lateral malleolus.  There is stable scab present to the left medial ankle over the medial malleolus measuring 0.3 cm likely limited to breakdown skin.  Musculoskeletal Examination: Muscle strength 5/5 to b/l LE. HAV with bunion deformity noted b/l LE. Fibular deviation of digits 2-5 bilaterally. Plantar fat pad atrophy of forefoot area b/l lower  extremities. Wearing appropriate fitting shoe gear.  Presenting using wheelchair today.  Radiographs: None Assessment:   1. Pain due to onychomycosis of toenails of both feet   2. Rheumatoid arthritis involving multiple sites with positive rheumatoid factor ([facility])   3. Ankle wound, right, initial encounter   4. Ankle wound, left, initial encounter    Plan:  -Patient was evaluated and treated. All patient's and/or POA's questions/concerns answered on today's visit. -Patient to continue soft, supportive shoe gear daily. -Mycotic toenails 1-5 bilaterally were debrided in length and girth with sterile nail nippers and dremel without incident. -Regarding the ankle ulcerations, these are superficial in nature.  Antibacterial cream was applied to the right ankle wounds and both were dressed with silicone foam border foam dressing - Instructed patient to offload these areas when sitting in chair or lying down and to prop legs up with pillows proximal to the ankle so that the bony prominences are not subjected to more pressure.  He can also look into buying offloading boots. -Do expect these to resolve, without further intervention with appropriate offloading as they do not appear acutely infected. -Patient/POA to call should there be question/concern in the interim.  Return in about 3 months (around 04/10/2023) for Routine Foot Care.  Ethan LITTIE Saddler, DPM

## 2023-07-17 ENCOUNTER — Ambulatory Visit: Admitting: Podiatry

## 2023-07-17 ENCOUNTER — Encounter: Payer: Self-pay | Admitting: Podiatry

## 2023-07-17 DIAGNOSIS — B351 Tinea unguium: Secondary | ICD-10-CM | POA: Diagnosis not present

## 2023-07-17 DIAGNOSIS — M79675 Pain in left toe(s): Secondary | ICD-10-CM | POA: Diagnosis not present

## 2023-07-17 DIAGNOSIS — M79674 Pain in right toe(s): Secondary | ICD-10-CM

## 2023-07-17 NOTE — Progress Notes (Signed)
 This patient presents to the office with chief complaint of long thick painful nails.  Patient says the nails are painful wearing shoes.  This patient is unable to self treat.  This patient is unable to trim her nails since he is unable to reach his  nails.  he presents to the office for preventative foot care services. He presents to the office in a wheelchair and is accompanied by his wife.  General Appearance  Alert, conversant and in no acute stress.  Vascular  Dorsalis pedis and posterior tibial  pulses are weakly  palpable  bilaterally.  Capillary return is within normal limits  bilaterally. Temperature is within normal limits  bilaterally.  Neurologic  Senn-Weinstein monofilament wire test within normal limits  bilaterally. Muscle power within normal limits bilaterally.  Nails Thick disfigured discolored nails with subungual debris  from hallux to fifth toes bilaterally. No evidence of bacterial infection or drainage bilaterally. His toenails are overgrown and cutting into his skin distally.  His left hallux nail is unattached.  Orthopedic  No limitations of motion  feet .  No crepitus or effusions noted.  No bony pathology or digital deformities noted.  Skin  normotropic skin with no porokeratosis noted bilaterally.  No signs of infections or ulcers noted.     Onychomycosis  Nails  B/L.  Pain in right toes  Pain in left toes  Debridement of nails both feet followed trimming the nails with dremel tool.  Hallux nail left foot has  self avulsed.  RTC 3 months.   Cordella Bold DPM

## 2023-10-10 ENCOUNTER — Ambulatory Visit: Admitting: Podiatry

## 2023-10-31 ENCOUNTER — Encounter: Payer: Self-pay | Admitting: Podiatry

## 2023-10-31 ENCOUNTER — Ambulatory Visit: Admitting: Podiatry

## 2023-10-31 DIAGNOSIS — M79674 Pain in right toe(s): Secondary | ICD-10-CM

## 2023-10-31 DIAGNOSIS — B351 Tinea unguium: Secondary | ICD-10-CM

## 2023-10-31 DIAGNOSIS — M79675 Pain in left toe(s): Secondary | ICD-10-CM

## 2023-10-31 NOTE — Progress Notes (Signed)
 This patient presents to the office with chief complaint of long thick painful nails.  Patient says the nails are painful wearing shoes.  This patient is unable to self treat.  This patient is unable to trim her nails since he is unable to reach his  nails.  he presents to the office for preventative foot care services. He presents to the office in a wheelchair and is accompanied by his wife.  General Appearance  Alert, conversant and in no acute stress.  Vascular  Dorsalis pedis and posterior tibial  pulses are weakly  palpable  bilaterally.  Capillary return is within normal limits  bilaterally. Temperature is within normal limits  bilaterally.  Neurologic  Senn-Weinstein monofilament wire test within normal limits  bilaterally. Muscle power within normal limits bilaterally.  Nails Thick disfigured discolored nails with subungual debris  from hallux to fifth toes bilaterally. No evidence of bacterial infection or drainage bilaterally. His toenails are overgrown and cutting into his skin distally.    Orthopedic  No limitations of motion  feet .  No crepitus or effusions noted.  No bony pathology or digital deformities noted.  Skin  normotropic skin with no porokeratosis noted bilaterally.  No signs of infections or ulcers noted.     Onychomycosis  Nails  B/L.  Pain in right toes  Pain in left toes  Debridement of nails both feet followed trimming the nails with dremel tool.   RTC 3 months.   Cordella Bold DPM

## 2024-01-02 DEATH — deceased
